# Patient Record
Sex: Female | Born: 1985 | Race: Black or African American | Hispanic: No | Marital: Single | Smoking: Never smoker
Health system: Southern US, Community
[De-identification: ages and names within clinical notes are randomized; demographics above are authoritative.]

## PROBLEM LIST (undated history)

## (undated) DIAGNOSIS — G43909 Migraine, unspecified, not intractable, without status migrainosus: Secondary | ICD-10-CM

---
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
## 2004-12-26 ENCOUNTER — Emergency Department (HOSPITAL_COMMUNITY): Admission: EM | Admit: 2004-12-26 | Discharge: 2004-12-26 | Payer: Self-pay | Admitting: *Deleted

## 2007-09-11 ENCOUNTER — Other Ambulatory Visit: Admission: RE | Admit: 2007-09-11 | Discharge: 2007-09-11 | Payer: Self-pay | Admitting: Family Medicine

## 2008-12-06 ENCOUNTER — Emergency Department (HOSPITAL_COMMUNITY): Admission: EM | Admit: 2008-12-06 | Discharge: 2008-12-07 | Payer: Self-pay | Admitting: Emergency Medicine

## 2009-02-19 ENCOUNTER — Other Ambulatory Visit: Admission: RE | Admit: 2009-02-19 | Discharge: 2009-02-19 | Payer: Self-pay | Admitting: Family Medicine

## 2009-09-06 ENCOUNTER — Emergency Department (HOSPITAL_COMMUNITY): Admission: EM | Admit: 2009-09-06 | Discharge: 2009-09-06 | Payer: Self-pay | Admitting: Emergency Medicine

## 2010-04-08 LAB — POCT CARDIAC MARKERS: Myoglobin, poc: 32 ng/mL (ref 12–200)

## 2011-05-31 ENCOUNTER — Inpatient Hospital Stay (HOSPITAL_COMMUNITY): Admission: RE | Admit: 2011-05-31 | Payer: Self-pay | Source: Ambulatory Visit

## 2011-05-31 ENCOUNTER — Other Ambulatory Visit (HOSPITAL_COMMUNITY)
Admission: RE | Admit: 2011-05-31 | Discharge: 2011-05-31 | Disposition: A | Discharge status: Home / Self Care | Payer: 59 | Source: Ambulatory Visit | Attending: Family Medicine | Admitting: Family Medicine

## 2011-05-31 ENCOUNTER — Other Ambulatory Visit: Payer: Self-pay | Admitting: Family Medicine

## 2011-05-31 DIAGNOSIS — Z124 Encounter for screening for malignant neoplasm of cervix: Secondary | ICD-10-CM | POA: Insufficient documentation

## 2011-05-31 DIAGNOSIS — R87810 Cervical high risk human papillomavirus (HPV) DNA test positive: Secondary | ICD-10-CM | POA: Insufficient documentation

## 2011-05-31 DIAGNOSIS — Z113 Encounter for screening for infections with a predominantly sexual mode of transmission: Secondary | ICD-10-CM | POA: Insufficient documentation

## 2013-09-18 ENCOUNTER — Other Ambulatory Visit (HOSPITAL_COMMUNITY)
Admission: RE | Admit: 2013-09-18 | Discharge: 2013-09-18 | Disposition: A | Discharge status: Home / Self Care | Payer: 59 | Source: Ambulatory Visit | Attending: Family Medicine | Admitting: Family Medicine

## 2013-09-18 ENCOUNTER — Other Ambulatory Visit: Payer: Self-pay | Admitting: Family Medicine

## 2013-09-18 DIAGNOSIS — Z1151 Encounter for screening for human papillomavirus (HPV): Secondary | ICD-10-CM | POA: Diagnosis present

## 2013-09-18 DIAGNOSIS — Z124 Encounter for screening for malignant neoplasm of cervix: Secondary | ICD-10-CM | POA: Diagnosis not present

## 2013-09-18 DIAGNOSIS — N760 Acute vaginitis: Secondary | ICD-10-CM | POA: Diagnosis not present

## 2013-09-18 DIAGNOSIS — Z113 Encounter for screening for infections with a predominantly sexual mode of transmission: Secondary | ICD-10-CM | POA: Insufficient documentation

## 2013-09-22 LAB — CYTOLOGY - PAP

## 2014-01-23 HISTORY — PX: FRACTURE SURGERY: SHX138

## 2014-07-29 ENCOUNTER — Emergency Department (HOSPITAL_BASED_OUTPATIENT_CLINIC_OR_DEPARTMENT_OTHER): Payer: 59

## 2014-07-29 ENCOUNTER — Encounter (HOSPITAL_BASED_OUTPATIENT_CLINIC_OR_DEPARTMENT_OTHER): Payer: Self-pay | Admitting: *Deleted

## 2014-07-29 ENCOUNTER — Emergency Department (HOSPITAL_BASED_OUTPATIENT_CLINIC_OR_DEPARTMENT_OTHER)
Admission: EM | Admit: 2014-07-29 | Discharge: 2014-07-29 | Disposition: A | Discharge status: Home / Self Care | Payer: 59 | Attending: Emergency Medicine | Admitting: Emergency Medicine

## 2014-07-29 DIAGNOSIS — G43909 Migraine, unspecified, not intractable, without status migrainosus: Secondary | ICD-10-CM | POA: Diagnosis not present

## 2014-07-29 DIAGNOSIS — Y998 Other external cause status: Secondary | ICD-10-CM | POA: Diagnosis not present

## 2014-07-29 DIAGNOSIS — Y92410 Unspecified street and highway as the place of occurrence of the external cause: Secondary | ICD-10-CM | POA: Insufficient documentation

## 2014-07-29 DIAGNOSIS — Z79899 Other long term (current) drug therapy: Secondary | ICD-10-CM | POA: Insufficient documentation

## 2014-07-29 DIAGNOSIS — Y9389 Activity, other specified: Secondary | ICD-10-CM | POA: Diagnosis not present

## 2014-07-29 DIAGNOSIS — S6991XA Unspecified injury of right wrist, hand and finger(s), initial encounter: Secondary | ICD-10-CM | POA: Diagnosis present

## 2014-07-29 DIAGNOSIS — S52501A Unspecified fracture of the lower end of right radius, initial encounter for closed fracture: Secondary | ICD-10-CM

## 2014-07-29 HISTORY — DX: Migraine, unspecified, not intractable, without status migrainosus: G43.909

## 2014-07-29 MED ORDER — HYDROCODONE-ACETAMINOPHEN 5-325 MG PO TABS
2.0000 | ORAL_TABLET | Freq: Once | ORAL | Status: AC
  Administered 2014-07-29: 2 via ORAL
  Filled 2014-07-29: qty 2

## 2014-07-29 MED ORDER — OXYCODONE-ACETAMINOPHEN 5-325 MG PO TABS: 1.0000 | ORAL_TABLET | Freq: Four times a day (QID) | ORAL | Status: AC | PRN

## 2014-07-29 NOTE — ED Notes (Signed)
D/c home with ride- directed to pharmacy to pick up meds 

## 2014-07-29 NOTE — ED Notes (Signed)
Patient assisted with getting dressed and preparing for discharge.
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
## 2014-07-29 NOTE — ED Notes (Signed)
Patient transported to X-ray 
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
## 2014-07-29 NOTE — ED Notes (Addendum)
Ice pack given. [employer] officer at bedside

## 2014-07-29 NOTE — ED Notes (Signed)
MD at bedside. 

## 2015-09-22 ENCOUNTER — Other Ambulatory Visit (HOSPITAL_COMMUNITY)
Admission: RE | Admit: 2015-09-22 | Discharge: 2015-09-22 | Disposition: A | Discharge status: Home / Self Care | Payer: 59 | Source: Ambulatory Visit | Attending: Obstetrics and Gynecology | Admitting: Obstetrics and Gynecology

## 2015-09-22 ENCOUNTER — Other Ambulatory Visit: Payer: Self-pay | Admitting: Obstetrics and Gynecology

## 2015-09-22 DIAGNOSIS — Z1151 Encounter for screening for human papillomavirus (HPV): Secondary | ICD-10-CM | POA: Insufficient documentation

## 2015-09-22 DIAGNOSIS — Z01419 Encounter for gynecological examination (general) (routine) without abnormal findings: Secondary | ICD-10-CM | POA: Insufficient documentation

## 2015-09-23 LAB — CYTOLOGY - PAP

## 2017-09-07 ENCOUNTER — Ambulatory Visit
Admission: RE | Admit: 2017-09-07 | Discharge: 2017-09-07 | Disposition: A | Discharge status: Home / Self Care | Payer: BLUE CROSS/BLUE SHIELD | Source: Ambulatory Visit | Attending: Family Medicine | Admitting: Family Medicine

## 2017-09-07 ENCOUNTER — Other Ambulatory Visit: Payer: Self-pay | Admitting: Family Medicine

## 2017-09-07 DIAGNOSIS — J209 Acute bronchitis, unspecified: Secondary | ICD-10-CM

## 2019-11-15 ENCOUNTER — Encounter (HOSPITAL_COMMUNITY): Payer: Self-pay | Admitting: Emergency Medicine

## 2019-11-15 ENCOUNTER — Emergency Department (HOSPITAL_COMMUNITY)
Admission: EM | Admit: 2019-11-15 | Discharge: 2019-11-16 | Disposition: A | Discharge status: Home / Self Care | Payer: 59 | Source: Home / Self Care | Attending: Emergency Medicine | Admitting: Emergency Medicine

## 2019-11-15 ENCOUNTER — Other Ambulatory Visit: Payer: Self-pay

## 2019-11-15 DIAGNOSIS — R0602 Shortness of breath: Secondary | ICD-10-CM | POA: Diagnosis not present

## 2019-11-15 DIAGNOSIS — J22 Unspecified acute lower respiratory infection: Secondary | ICD-10-CM | POA: Insufficient documentation

## 2019-11-15 DIAGNOSIS — U071 COVID-19: Secondary | ICD-10-CM

## 2019-11-15 LAB — I-STAT BETA HCG BLOOD, ED (MC, WL, AP ONLY): I-stat hCG, quantitative: <5 m[IU]/mL (ref <5)

## 2019-11-15 LAB — BASIC METABOLIC PANEL
Anion gap: 11 (ref 5–15)
BUN: 6 mg/dL (ref 6–20)
CO2: 24 mmol/L (ref 22–32)
Calcium: 8.4 mg/dL — ABNORMAL LOW (ref 8.9–10.3)
Chloride: 97 mmol/L — ABNORMAL LOW (ref 98–111)
Creatinine, Ser: 1.06 mg/dL — ABNORMAL HIGH (ref 0.44–1.00)
GFR, Estimated: >60 mL/min (ref >60)
Glucose, Bld: 112 mg/dL — ABNORMAL HIGH (ref 70–99)
Potassium: 3.2 mmol/L — ABNORMAL LOW (ref 3.5–5.1)
Sodium: 132 mmol/L — ABNORMAL LOW (ref 135–145)

## 2019-11-15 LAB — CBC
HCT: 35.9 % — ABNORMAL LOW (ref 36.0–46.0)
Hemoglobin: 11.3 g/dL — ABNORMAL LOW (ref 12.0–15.0)
MCH: 25.6 pg — ABNORMAL LOW (ref 26.0–34.0)
MCHC: 31.5 g/dL (ref 30.0–36.0)
MCV: 81.4 fL (ref 80.0–100.0)
Platelets: 204 10*3/uL (ref 150–400)
RBC: 4.41 MIL/uL (ref 3.87–5.11)
RDW: 14.8 % (ref 11.5–15.5)
WBC: 3.9 10*3/uL — ABNORMAL LOW (ref 4.0–10.5)
nRBC: 0 % (ref 0.0–0.2)

## 2019-11-15 LAB — TROPONIN I (HIGH SENSITIVITY)
Troponin I (High Sensitivity): 11 ng/L (ref <18)
Troponin I (High Sensitivity): 11 ng/L (ref <18)

## 2019-11-15 MED ORDER — ALBUTEROL SULFATE HFA 108 (90 BASE) MCG/ACT IN AERS
4.0000 | INHALATION_SPRAY | Freq: Once | RESPIRATORY_TRACT | Status: AC
  Administered 2019-11-16: 4 via RESPIRATORY_TRACT
  Filled 2019-11-15: qty 6.7

## 2019-11-15 MED ORDER — AEROCHAMBER PLUS FLO-VU LARGE MISC
1.0000 | Freq: Once | Status: AC
  Administered 2019-11-16: 1

## 2019-11-15 MED ORDER — DEXAMETHASONE 4 MG PO TABS
10.0000 mg | ORAL_TABLET | Freq: Once | ORAL | Status: AC
  Administered 2019-11-16: 10 mg via ORAL
  Filled 2019-11-15: qty 3

## 2019-11-15 NOTE — ED Triage Notes (Signed)
Pt states she tested positive for Covid this past  Thursday having increase SOB and CP.

## 2019-11-15 NOTE — ED Provider Notes (Signed)
[facility] EMERGENCY DEPARTMENT Provider Note  CSN: [id number] Arrival date & time: 11/15/19 2006  Chief Complaint(s) Shortness of Breath  HPI Elizabeth Christian is a 34 y.o. female who was diagnosed with COVID-19 earlier this week presents with shortness of breath and chest discomfort.  She reports that her symptoms began Tuesday, 6 days ago.  Symptoms have been worsening since onset with increased coughing and shortness of breath.  She denies any nausea or vomiting.  Still able to tolerate oral intake.  She does report loss of smell and taste.  No abdominal pain.  No other physical complaints.  HPI  Past Medical History Past Medical History:  Diagnosis Date  . Migraine    There are no problems to display for this patient.  Home Medication(s) Prior to Admission medications   Medication Sig Start Date End Date Taking? Authorizing Provider  multivitamin prental (TRINATAL) 60-1 MG TABS tablet Take 1 tablet by mouth daily.    [provider]  oxyCODONE-acetaminophen (PERCOCET/ROXICET) 5-325 MG per tablet Take 1-2 tablets by mouth every 6 (six) hours as needed for severe pain. 07/29/14   Blake Divine, MD  topiramate (TOPAMAX) 50 MG tablet Take 50 mg by mouth 2 (two) times daily.    [provider]                                                                                                                                    Past Surgical History History reviewed. No pertinent surgical history. Family History No family history on file.  Social History Social History   Tobacco Use  . Smoking status: Never Smoker  Substance Use Topics  . Alcohol use: Yes    Comment: occassional  . Drug use: No   Allergies Patient has no known allergies.  Review of Systems Review of Systems All other systems are reviewed and are negative for acute change except as noted in the HPI  Physical Exam Vital Signs  I have reviewed the triage vital signs BP  130/86 (BP Location: Right Arm)   Pulse (!) 109   Temp 99.9 F (37.7 C) (Oral)   Resp (!) 28   Ht 5\' 9"  (1.753 m)   Wt 123.4 kg   SpO2 92%   BMI 40.17 kg/m   Physical Exam Vitals reviewed.  Constitutional:      General: She is not in acute distress.    Appearance: She is well-developed. She is not diaphoretic.  HENT:     Head: Normocephalic and atraumatic.     Nose: Nose normal.  Eyes:     General: No scleral icterus.       Right eye: No discharge.        Left eye: No discharge.     Conjunctiva/sclera: Conjunctivae normal.     Pupils: Pupils are equal, round, and reactive to light.  Cardiovascular:     Rate and Rhythm: Normal  rate and regular rhythm.     Heart sounds: No murmur heard.  No friction rub. No gallop.   Pulmonary:     Effort: Pulmonary effort is normal. Tachypnea present. No respiratory distress.     Breath sounds: No stridor. Examination of the right-middle field reveals rales. Examination of the left-middle field reveals rales. Examination of the right-lower field reveals rales. Examination of the left-lower field reveals rales. Rales (faint ) present.     Comments: Speaking in full sentences.  Abdominal:     General: There is no distension.     Palpations: Abdomen is soft.     Tenderness: There is no abdominal tenderness.  Musculoskeletal:        General: No tenderness.     Cervical back: Normal range of motion and neck supple.  Skin:    General: Skin is warm and dry.     Findings: No erythema or rash.  Neurological:     Mental Status: She is alert and oriented to person, place, and time.     ED Results and Treatments Labs (all labs ordered are listed, but only abnormal results are displayed) Labs Reviewed  BASIC METABOLIC PANEL - Abnormal; Notable for the following components:      Result Value   Sodium 132 (*)    Potassium 3.2 (*)    Chloride 97 (*)    Glucose, Bld 112 (*)    Creatinine, Ser 1.06 (*)    Calcium 8.4 (*)    All other  components within normal limits  CBC - Abnormal; Notable for the following components:   WBC 3.9 (*)    Hemoglobin 11.3 (*)    HCT 35.9 (*)    MCH 25.6 (*)    All other components within normal limits  I-STAT BETA HCG BLOOD, ED ([facility], [facility], [facility] ONLY)  TROPONIN I (HIGH SENSITIVITY)  TROPONIN I (HIGH SENSITIVITY)                                                                                                                         EKG  EKG Interpretation  Date/Time:  Saturday November 15 2019 20:16:43 EDT Ventricular Rate:  119 PR Interval:  146 QRS Duration: 74 QT Interval:  306 QTC Calculation: 430 R Axis:   -3 Text Interpretation: Sinus tachycardia Otherwise normal ECG Since last tracing rate faster Otherwise no significant change Confirmed by Drema Pry [phone]) on 11/15/2019 11:46:51 PM      Radiology No results found.  Pertinent labs & imaging results that were available during my care of the patient were reviewed by me and considered in my medical decision making (see chart for details).  Medications Ordered in ED Medications  AeroChamber Plus Flo-Vu Large MISC 1 each (has no administration in time range)  albuterol (VENTOLIN HFA) 108 (90 Base) MCG/ACT inhaler 4 puff (4 puffs Inhalation Given 11/16/19 0015)  dexamethasone (DECADRON) tablet 10 mg (10 mg Oral Given 11/16/19 0014)  Procedures Procedures  (including critical care time)  Medical Decision Making / ED Course I have reviewed the nursing notes for this encounter and the patient's prior records (if available in EHR or on provided paperwork).   Elizabeth Christian was evaluated in Emergency Department on 11/16/2019 for the symptoms described in the history of present illness. She was evaluated in the context of the global COVID-19 pandemic, which necessitated consideration that the  patient might be at risk for infection with the SARS-CoV-2 virus that causes COVID-19. Institutional protocols and algorithms that pertain to the evaluation of patients at risk for COVID-19 are in a state of rapid change based on information released by regulatory bodies including the CDC and federal and state organizations. These policies and algorithms were followed during the patient's care in the ED.  Patient presents with worsening symptoms related to COVID-19.  Patient is tachypneic but satting well on room air with sats of 94% and greater.  Patient has mild tachycardia expected in her situation.  Patient was provided with albuterol puffs and Decadron.  On reassessment, I feel she is stable for continued outpatient management.  Given the patient's BMI, she will qualify for MAB.  I contacted the infusion center who will assist in scheduling her visit.       Final Clinical Impression(s) / ED Diagnoses Final diagnoses:  Lower respiratory tract infection due to COVID-19 virus    The patient appears reasonably screened and/or stabilized for discharge and I doubt any other medical condition or other [facility] requiring further screening, evaluation, or treatment in the ED at this time prior to discharge. Safe for discharge with strict return precautions.  Disposition: Discharge  Condition: Good  I have discussed the results, Dx and Tx plan with the patient/family who expressed understanding and agree(s) with the plan. Discharge instructions discussed at length. The patient/family was given strict return precautions who verbalized understanding of the instructions. No further questions at time of discharge.    ED Discharge Orders    None        Follow Up: Clinic, [facility] Covid-19 Infusion Treatment [phone]     Mila Palmer, MD [address] [place] [address] [phone]  Schedule an appointment as soon as possible for a visit  As  needed     This chart was dictated using voice recognition software.  Despite best efforts to proofread,  errors can occur which can change the documentation meaning.   Nira Conn, MD 11/16/19 [phone]

## 2019-11-16 ENCOUNTER — Telehealth: Payer: Self-pay | Admitting: Nurse Practitioner

## 2019-11-16 ENCOUNTER — Other Ambulatory Visit: Payer: Self-pay | Admitting: Nurse Practitioner

## 2019-11-16 DIAGNOSIS — Z6841 Body Mass Index (BMI) 40.0 and over, adult: Secondary | ICD-10-CM

## 2019-11-16 DIAGNOSIS — U071 COVID-19: Secondary | ICD-10-CM

## 2019-11-16 MED ORDER — PREDNISONE 50 MG PO TABS: 50.0000 mg | ORAL_TABLET | Freq: Every day | ORAL | 0 refills | Status: DC

## 2019-11-16 NOTE — Telephone Encounter (Signed)
COVID [facility] Contact Note   Qualifiers: BMI 40, Not vaccinated,  Symptoms: ShOB, Chest discomfort, loss of taste, loss smell Symptom Onset: 10/19  I connected by phone with Elizabeth Christian on 11/16/2019 at 2:48 PM to discuss the potential use of a new treatment for mild to moderate COVID-19 viral infection in non-hospitalized patients.  This patient is a 34 y.o. female that meets the FDA criteria for Emergency Use Authorization of COVID monoclonal antibody casirivimab/imdevimab or bamlanivimab/eteseviamb.  Has a (+) direct SARS-CoV-2 viral test result  Has mild or moderate COVID-19   Is NOT hospitalized due to COVID-19  Is within 10 days of symptom onset  Has at least one of the high risk factor(s) for progression to severe COVID-19 and/or hospitalization as defined in EUA.  Specific high risk criteria : BMI > 25, non-vaccinated, SVI  Day 7-8 of symptoms   I have spoken and communicated the following to the patient or parent/caregiver regarding COVID monoclonal antibody treatment:  1. FDA has authorized the emergency use for the treatment of mild to moderate COVID-19 in adults and pediatric patients with positive results of direct SARS-CoV-2 viral testing who are 35 years of age and older weighing at least 40 kg, and who are at high risk for progressing to severe COVID-19 and/or hospitalization.  2. The significant known and potential risks and benefits of COVID monoclonal antibody, and the extent to which such potential risks and benefits are unknown.  3. Information on available alternative treatments and the risks and benefits of those alternatives, including clinical trials.  4. Patients treated with COVID monoclonal antibody should continue to self-isolate and use infection control measures (e.g., wear mask, isolate, social distance, avoid sharing personal items, clean and disinfect "high touch" surfaces, and frequent handwashing) according to CDC guidelines.   5. The  patient or parent/caregiver has the option to accept or refuse COVID monoclonal antibody treatment.  After reviewing this information with the patient, the patient has agreed to receive one of the available covid 19 monoclonal antibodies and will be provided an appropriate fact sheet prior to infusion. Tollie Eth, NP 11/16/2019 2:48 PM

## 2019-11-16 NOTE — ED Notes (Signed)
Pt d/c by MD, Pt is provided w/ d/c instructions and follow up care. Pt is out of the ED ambulatory by self

## 2019-11-16 NOTE — ED Notes (Signed)
Provided a spacer for pt to take home

## 2019-11-17 ENCOUNTER — Emergency Department (HOSPITAL_COMMUNITY): Payer: 59

## 2019-11-17 ENCOUNTER — Other Ambulatory Visit: Payer: Self-pay

## 2019-11-17 ENCOUNTER — Inpatient Hospital Stay (HOSPITAL_COMMUNITY)
Admission: EM | Admit: 2019-11-17 | Discharge: 2019-11-21 | DRG: 177 | Disposition: A | Discharge status: Home / Self Care | Payer: 59 | Attending: Internal Medicine | Admitting: Internal Medicine

## 2019-11-17 ENCOUNTER — Encounter (HOSPITAL_COMMUNITY): Payer: Self-pay | Admitting: Radiology

## 2019-11-17 DIAGNOSIS — Z7952 Long term (current) use of systemic steroids: Secondary | ICD-10-CM | POA: Diagnosis not present

## 2019-11-17 DIAGNOSIS — U071 COVID-19: Secondary | ICD-10-CM | POA: Diagnosis present

## 2019-11-17 DIAGNOSIS — E871 Hypo-osmolality and hyponatremia: Secondary | ICD-10-CM | POA: Diagnosis present

## 2019-11-17 DIAGNOSIS — E872 Acidosis: Secondary | ICD-10-CM | POA: Diagnosis present

## 2019-11-17 DIAGNOSIS — Z713 Dietary counseling and surveillance: Secondary | ICD-10-CM | POA: Diagnosis not present

## 2019-11-17 DIAGNOSIS — N179 Acute kidney failure, unspecified: Secondary | ICD-10-CM | POA: Diagnosis present

## 2019-11-17 DIAGNOSIS — R0602 Shortness of breath: Secondary | ICD-10-CM | POA: Diagnosis present

## 2019-11-17 DIAGNOSIS — R739 Hyperglycemia, unspecified: Secondary | ICD-10-CM | POA: Diagnosis present

## 2019-11-17 DIAGNOSIS — R000 Tachycardia, unspecified: Secondary | ICD-10-CM

## 2019-11-17 DIAGNOSIS — Z6841 Body Mass Index (BMI) 40.0 and over, adult: Secondary | ICD-10-CM

## 2019-11-17 DIAGNOSIS — J9601 Acute respiratory failure with hypoxia: Secondary | ICD-10-CM | POA: Diagnosis present

## 2019-11-17 DIAGNOSIS — R7401 Elevation of levels of liver transaminase levels: Secondary | ICD-10-CM | POA: Diagnosis present

## 2019-11-17 DIAGNOSIS — J1282 Pneumonia due to coronavirus disease 2019: Secondary | ICD-10-CM | POA: Diagnosis present

## 2019-11-17 DIAGNOSIS — Z79899 Other long term (current) drug therapy: Secondary | ICD-10-CM | POA: Diagnosis not present

## 2019-11-17 DIAGNOSIS — E876 Hypokalemia: Secondary | ICD-10-CM | POA: Diagnosis present

## 2019-11-17 LAB — COMPREHENSIVE METABOLIC PANEL
ALT: 87 U/L — ABNORMAL HIGH (ref 0–44)
AST: 117 U/L — ABNORMAL HIGH (ref 15–41)
Albumin: 2.8 g/dL — ABNORMAL LOW (ref 3.5–5.0)
Alkaline Phosphatase: 81 U/L (ref 38–126)
Anion gap: 14 (ref 5–15)
BUN: 8 mg/dL (ref 6–20)
CO2: 24 mmol/L (ref 22–32)
Calcium: 8.5 mg/dL — ABNORMAL LOW (ref 8.9–10.3)
Chloride: 96 mmol/L — ABNORMAL LOW (ref 98–111)
Creatinine, Ser: 1.16 mg/dL — ABNORMAL HIGH (ref 0.44–1.00)
GFR, Estimated: >60 mL/min (ref >60)
Glucose, Bld: 128 mg/dL — ABNORMAL HIGH (ref 70–99)
Potassium: 3.4 mmol/L — ABNORMAL LOW (ref 3.5–5.1)
Sodium: 134 mmol/L — ABNORMAL LOW (ref 135–145)
Total Bilirubin: 0.9 mg/dL (ref 0.3–1.2)
Total Protein: 7.1 g/dL (ref 6.5–8.1)

## 2019-11-17 LAB — PROCALCITONIN: Procalcitonin: <0.1 ng/mL

## 2019-11-17 LAB — GLUCOSE, CAPILLARY
Glucose-Capillary: 135 mg/dL — ABNORMAL HIGH (ref 70–99)
Glucose-Capillary: 137 mg/dL — ABNORMAL HIGH (ref 70–99)
Glucose-Capillary: 149 mg/dL — ABNORMAL HIGH (ref 70–99)

## 2019-11-17 LAB — CBC WITH DIFFERENTIAL/PLATELET
Abs Immature Granulocytes: 0.05 10*3/uL (ref 0.00–0.07)
Basophils Absolute: 0 10*3/uL (ref 0.0–0.1)
Basophils Relative: 0 %
Eosinophils Absolute: 0 10*3/uL (ref 0.0–0.5)
Eosinophils Relative: 0 %
HCT: 35.5 % — ABNORMAL LOW (ref 36.0–46.0)
Hemoglobin: 11.3 g/dL — ABNORMAL LOW (ref 12.0–15.0)
Immature Granulocytes: 1 %
Lymphocytes Relative: 7 %
Lymphs Abs: 0.6 10*3/uL — ABNORMAL LOW (ref 0.7–4.0)
MCH: 26.1 pg (ref 26.0–34.0)
MCHC: 31.8 g/dL (ref 30.0–36.0)
MCV: 82 fL (ref 80.0–100.0)
Monocytes Absolute: 0.2 10*3/uL (ref 0.1–1.0)
Monocytes Relative: 2 %
Neutro Abs: 7.9 10*3/uL — ABNORMAL HIGH (ref 1.7–7.7)
Neutrophils Relative %: 90 %
Platelets: 200 10*3/uL (ref 150–400)
RBC: 4.33 MIL/uL (ref 3.87–5.11)
RDW: 15.1 % (ref 11.5–15.5)
WBC: 8.8 10*3/uL (ref 4.0–10.5)
nRBC: 0 % (ref 0.0–0.2)

## 2019-11-17 LAB — C-REACTIVE PROTEIN: CRP: 8.3 mg/dL — ABNORMAL HIGH (ref <1.0)

## 2019-11-17 LAB — I-STAT BETA HCG BLOOD, ED (MC, WL, AP ONLY): I-stat hCG, quantitative: <5 m[IU]/mL (ref <5)

## 2019-11-17 LAB — FIBRINOGEN: Fibrinogen: 628 mg/dL — ABNORMAL HIGH (ref 210–475)

## 2019-11-17 LAB — FERRITIN: Ferritin: 105 ng/mL (ref 11–307)

## 2019-11-17 LAB — TROPONIN I (HIGH SENSITIVITY)
Troponin I (High Sensitivity): 20 ng/L — ABNORMAL HIGH (ref <18)
Troponin I (High Sensitivity): 16 ng/L (ref <18)

## 2019-11-17 LAB — D-DIMER, QUANTITATIVE: D-Dimer, Quant: 0.61 ug/mL-FEU — ABNORMAL HIGH (ref 0.00–0.50)

## 2019-11-17 LAB — LACTATE DEHYDROGENASE: LDH: 375 U/L — ABNORMAL HIGH (ref 98–192)

## 2019-11-17 LAB — TRIGLYCERIDES: Triglycerides: 107 mg/dL (ref <150)

## 2019-11-17 LAB — LACTIC ACID, PLASMA
Lactic Acid, Venous: 2.9 mmol/L (ref 0.5–1.9)
Lactic Acid, Venous: 1.6 mmol/L (ref 0.5–1.9)

## 2019-11-17 LAB — MRSA PCR SCREENING: MRSA by PCR: NEGATIVE

## 2019-11-17 MED ORDER — SODIUM CHLORIDE 0.9 % IV SOLN
100.0000 mg | INTRAVENOUS | Status: AC
  Administered 2019-11-17 (×2): 100 mg via INTRAVENOUS
  Filled 2019-11-17 (×2): qty 20

## 2019-11-17 MED ORDER — PREDNISONE 20 MG PO TABS: 50.0000 mg | ORAL_TABLET | Freq: Every day | ORAL | Status: DC

## 2019-11-17 MED ORDER — SODIUM CHLORIDE 0.9 % IV SOLN
100.0000 mg | Freq: Every day | INTRAVENOUS | Status: AC
  Administered 2019-11-18 – 2019-11-21 (×4): 100 mg via INTRAVENOUS
  Filled 2019-11-17 (×4): qty 20

## 2019-11-17 MED ORDER — ENOXAPARIN SODIUM 80 MG/0.8ML ~~LOC~~ SOLN
65.0000 mg | SUBCUTANEOUS | Status: DC
  Administered 2019-11-17 – 2019-11-20 (×4): 65 mg via SUBCUTANEOUS
  Filled 2019-11-17 (×4): qty 0.8

## 2019-11-17 MED ORDER — SODIUM CHLORIDE 0.9% FLUSH: 3.0000 mL | INTRAVENOUS | Status: DC | PRN

## 2019-11-17 MED ORDER — METHYLPREDNISOLONE SODIUM SUCC 125 MG IJ SOLR
60.0000 mg | Freq: Two times a day (BID) | INTRAMUSCULAR | Status: DC
  Administered 2019-11-17 – 2019-11-18 (×2): 60 mg via INTRAVENOUS
  Filled 2019-11-17 (×2): qty 2

## 2019-11-17 MED ORDER — SODIUM CHLORIDE 0.9 % IV BOLUS
500.0000 mL | Freq: Once | INTRAVENOUS | Status: AC
  Administered 2019-11-17: 500 mL via INTRAVENOUS

## 2019-11-17 MED ORDER — IOHEXOL 350 MG/ML SOLN
75.0000 mL | Freq: Once | INTRAVENOUS | Status: AC | PRN
  Administered 2019-11-17: 75 mL via INTRAVENOUS

## 2019-11-17 MED ORDER — INSULIN ASPART 100 UNIT/ML ~~LOC~~ SOLN
0.0000 [IU] | Freq: Three times a day (TID) | SUBCUTANEOUS | Status: DC
  Administered 2019-11-17 (×2): 1 [IU] via SUBCUTANEOUS
  Administered 2019-11-18: 2 [IU] via SUBCUTANEOUS
  Administered 2019-11-18 – 2019-11-20 (×4): 1 [IU] via SUBCUTANEOUS

## 2019-11-17 MED ORDER — DEXAMETHASONE SODIUM PHOSPHATE 10 MG/ML IJ SOLN
10.0000 mg | Freq: Once | INTRAMUSCULAR | Status: AC
  Administered 2019-11-17: 10 mg via INTRAVENOUS
  Filled 2019-11-17: qty 1

## 2019-11-17 MED ORDER — SODIUM CHLORIDE 0.9 % IV SOLN: 100.0000 mg | Freq: Every day | INTRAVENOUS | Status: DC

## 2019-11-17 MED ORDER — TOCILIZUMAB 400 MG/20ML IV SOLN
800.0000 mg | Freq: Once | INTRAVENOUS | Status: AC
  Administered 2019-11-17: 800 mg via INTRAVENOUS
  Filled 2019-11-17: qty 40

## 2019-11-17 MED ORDER — SODIUM CHLORIDE 0.9 % IV SOLN: 200.0000 mg | Freq: Once | INTRAVENOUS | Status: DC

## 2019-11-17 MED ORDER — SODIUM CHLORIDE 0.9 % IV SOLN: 250.0000 mL | INTRAVENOUS | Status: DC | PRN

## 2019-11-17 MED ORDER — SODIUM CHLORIDE 0.9% FLUSH
3.0000 mL | Freq: Two times a day (BID) | INTRAVENOUS | Status: DC
  Administered 2019-11-17 – 2019-11-21 (×9): 3 mL via INTRAVENOUS

## 2019-11-17 NOTE — ED Provider Notes (Signed)
[facility] EMERGENCY DEPARTMENT Provider Note   CSN: [id number] Arrival date & time: 11/17/19  0454     History Chief Complaint  Patient presents with  . Covid Positive  . Shortness of Breath    Elizabeth Christian is a 34 y.o. female with a hx of migraine presents to the Emergency Department complaining of gradual, persistent, progressively worsening shortness of breath and chest discomfort onset Tuesday, 6 days ago.  Patient recently tested positive for COVID-19.  Her sister is sick with the same.  Yesterday she was evaluated in the emergency department for similar symptoms, was without hypoxia vomiting or hypotension.  She was able to tolerate p.o. I did not have abdominal pain.  She has had associated loss of taste and smell.  Patient is fully vaccinated for COVID-19.  Tonight, patient became acutely short of breath despite use of albuterol treatments.  Ambulation made her symptoms worse.  Upon EMS arrival patient found to have oxygen saturation at 40% and was tachycardic.  Placed on nonrebreather at 15 L with improvement.  Patient significantly tachypneic prehospital.  Level 5 caveat for acuity of condition.   The history is provided by the patient, medical records and the EMS personnel. No language interpreter was used.       Past Medical History:  Diagnosis Date  . Migraine     There are no problems to display for this patient.   No past surgical history on file.   OB History   No obstetric history on file.     No family history on file.  Social History   Tobacco Use  . Smoking status: Never Smoker  Substance Use Topics  . Alcohol use: Yes    Comment: occassional  . Drug use: No    Home Medications Prior to Admission medications   Medication Sig Start Date End Date Taking? Authorizing Provider  multivitamin prental (TRINATAL) 60-1 MG TABS tablet Take 1 tablet by mouth daily.    [provider]  oxyCODONE-acetaminophen  (PERCOCET/ROXICET) 5-325 MG per tablet Take 1-2 tablets by mouth every 6 (six) hours as needed for severe pain. 07/29/14   Blake Divine, MD  predniSONE (DELTASONE) 50 MG tablet Take 1 tablet (50 mg total) by mouth daily. 11/16/19   Tollie Eth, NP  topiramate (TOPAMAX) 50 MG tablet Take 50 mg by mouth 2 (two) times daily.    [provider]    Allergies    Patient has no known allergies.  Review of Systems   Review of Systems  Unable to perform ROS: Acuity of condition  Constitutional: Positive for chills.  Respiratory: Positive for cough, chest tightness and shortness of breath.     Physical Exam Updated Vital Signs BP (!) 142/92   Pulse (!) 133   Temp 97.8 F (36.6 C)   Resp (!) 52   Ht 5\' 9"  (1.753 m)   Wt 127 kg   SpO2 (!) 15%   BMI 41.35 kg/m   Physical Exam Vitals and nursing note reviewed.  Constitutional:      General: She is in acute distress.     Appearance: She is ill-appearing. She is not diaphoretic.  HENT:     Head: Normocephalic.  Eyes:     General: No scleral icterus.    Conjunctiva/sclera: Conjunctivae normal.  Cardiovascular:     Rate and Rhythm: Regular rhythm. Tachycardia present.     Pulses: Normal pulses.          Radial pulses  are 2+ on the right side and 2+ on the left side.       Dorsalis pedis pulses are 2+ on the right side and 2+ on the left side.  Pulmonary:     Effort: Tachypnea, accessory muscle usage, respiratory distress and retractions present. No prolonged expiration.     Breath sounds: No stridor. Rales ( Throughout) present.     Comments: Equal chest rise. Significant increased work of breathing. Abdominal:     General: There is no distension.     Palpations: Abdomen is soft.     Tenderness: There is no abdominal tenderness. There is no guarding or rebound.  Musculoskeletal:     Cervical back: Normal range of motion.     Comments: Moves all extremities equally and without difficulty. No calf tenderness or peripheral  edema.  Skin:    General: Skin is warm and dry.     Capillary Refill: Capillary refill takes less than 2 seconds.  Neurological:     Mental Status: She is alert.     GCS: GCS eye subscore is 4. GCS verbal subscore is 5. GCS motor subscore is 6.     Comments: Speech is clear and goal oriented.  Psychiatric:        Mood and Affect: Mood is anxious.     ED Results / Procedures / Treatments   Labs (all labs ordered are listed, but only abnormal results are displayed) Labs Reviewed  CBC WITH DIFFERENTIAL/PLATELET - Abnormal; Notable for the following components:      Result Value   Hemoglobin 11.3 (*)    HCT 35.5 (*)    Neutro Abs 7.9 (*)    Lymphs Abs 0.6 (*)    All other components within normal limits  CULTURE, BLOOD (ROUTINE X 2)  CULTURE, BLOOD (ROUTINE X 2)  LACTIC ACID, PLASMA  LACTIC ACID, PLASMA  COMPREHENSIVE METABOLIC PANEL  D-DIMER, QUANTITATIVE (NOT AT [facility])  PROCALCITONIN  LACTATE DEHYDROGENASE  TRIGLYCERIDES  FIBRINOGEN  C-REACTIVE PROTEIN  FERRITIN  I-STAT BETA HCG BLOOD, ED ([facility], [facility], [facility] ONLY)    EKG EKG Interpretation  Date/Time:  Monday November 17 2019 04:59:27 EDT Ventricular Rate:  129 PR Interval:    QRS Duration: 90 QT Interval:  302 QTC Calculation: 443 R Axis:   42 Text Interpretation: Sinus tachycardia Borderline T wave abnormalities When compared with ECG of 11/15/2019, No significant change was found Confirmed by Dione Booze ([id number]) on 11/17/2019 5:15:04 AM   Radiology DG Chest Port 1 View  Result Date: 11/17/2019 CLINICAL DATA:  Shortness of breath and COVID EXAM: PORTABLE CHEST 1 VIEW COMPARISON:  Nine days ago FINDINGS: Lower lung volumes with new bilateral hazy infiltrates. No effusion or pneumothorax. Normal heart size. IMPRESSION: Low volume chest with multifocal infiltrate. Electronically Signed   By: Marnee Spring M.D.   On: 11/17/2019 05:21    Procedures .Critical Care Performed by: Dierdre Forth, PA-C Authorized  by: Dierdre Forth, PA-C   Critical care provider statement:    Critical care time (minutes):  45   Critical care time was exclusive of:  Separately billable procedures and treating other patients and teaching time   Critical care was necessary to treat or prevent imminent or life-threatening deterioration of the following conditions:  Respiratory failure   Critical care was time spent personally by me on the following activities:  Discussions with consultants, evaluation of patient's response to treatment, examination of patient, ordering and performing treatments and interventions, ordering and review of laboratory studies,  ordering and review of radiographic studies, pulse oximetry, re-evaluation of patient's condition, obtaining history from patient or surrogate and review of old charts   I assumed direction of critical care for this patient from another provider in my specialty: no     (including critical care time)  Medications Ordered in ED Medications  remdesivir 100 mg in sodium chloride 0.9 % 100 mL IVPB (has no administration in time range)  remdesivir 100 mg in sodium chloride 0.9 % 100 mL IVPB (has no administration in time range)  dexamethasone (DECADRON) injection 10 mg (10 mg Intravenous Given 11/17/19 0515)    ED Course  I have reviewed the triage vital signs and the nursing notes.  Pertinent labs & imaging results that were available during my care of the patient were reviewed by me and considered in my medical decision making (see chart for details).  Clinical Course as of Nov 17 631  [facility] Nov 17, 2019  0550 Baseline  Hemoglobin(!): 11.3 [HM]  0613 Worsening AKI  Creatinine(!): 1.16 [HM]  0613 Elevated. Expected in the setting of COVID-19 however given severe tachycardia will obtain CTA.  D-Dimer, Quant(!): 0.61 [HM]  [id number] Elevated  LDH(!): 375 [HM]  0614 Fibrinogen(!): 628 [HM]    Clinical Course User Index [HM] Ranetta Armacost, Boyd Kerbs   MDM  Rules/Calculators/A&P                           Patient presents with severe hypoxemia in the setting of COVID-19.  Evaluated yesterday however has become significantly worse.  Patient was scheduled for monoclonal antibodies later today but did not make that appointment.  Patient with significant tachycardia here in the emergency department and profound hypoxia.  Remains on nonrebreather at 15 L.  Labs drawn.  Patient given Decadron and remdesivir.  She will need admission.  This time patient is a full code.  5:51 AM Chest x-ray with bilateral groundglass infiltrates consistent with COVID-19.  Personally evaluated these images.    Given profound tachycardia in the setting of COVID-19 concern for possible pulmonary embolism or myocarditis.  Troponin and CT angiogram pending.    6:34 AM At shift change care was transferred to [facility] [place] who will follow pending studies, re-evaulate and determine disposition.     Final Clinical Impression(s) / ED Diagnoses Final diagnoses:  Acute hypoxemic respiratory failure due to COVID-19 [facility])  Tachycardia  Shortness of breath    Rx / DC Orders ED Discharge Orders    None       Nabiha Planck, Boyd Kerbs 11/17/19 4270    Nira Conn, MD 11/17/19 [phone]

## 2019-11-17 NOTE — ED Provider Notes (Signed)
Patient was received at handoff from [facility] she provided HPI, current work-up, likely disposition please see her note for full detail.  In short patient arrives via EMS due to shortness of breath placed on a nonrebreather.  Patient was seen in the emergency department on 10/23 for shortness of breath after being diagnosed with Covid on 10/21, she does endorse that she is Covid vaccinated with the [place] vaccine.  She was provided with albuterol and Decadron and was stable for discharge.  Unfortunately, her shortness of breath worsened despite the albuterol and Decadron she received a few days ago.  Patient endorses worsening shortness of breath on exertion and has occasional left-sided chest tightness due to her increased breathing.  She denies any cardiac history.  She denies fevers chills, nasogastric cough congestion, abdominal pain nausea, vomiting, diarrhea, pedal edema.  She states she has no history of leg swelling or leg pain, no DVT or PE history, is not on hormone therapy, no recent surgeries, no long immobilization.    Previous provider ordered lab work showing CBC no leukocytosis, normocytic anemia appears to be a baseline for patient.  CMP showing hyponatremia, hypokalemia, hyperglycemia of 128, increased creatinine of 1.16, albumin 2.4, elevated AST and ALT, no anion gap noted.  Patient has elevated lactic 2.9, D-dimer elevated at 0.61, troponin of 20, hCG less than 5.  Chest x-ray shows low lung volume with multiple infiltrates.  EKG shows sinus tach without signs of ischemia no ST elevation depression noted.  CT angio chest ordered to rule out possible PE.  Patient remains on 15 L nonrebreather, provided Decadron and remdesivir.  Physical Exam  BP 111/65   Pulse (!) 117   Temp 97.8 F (36.6 C)   Resp (!) 52   Ht 5\' 9"  (1.753 m)   Wt 127 kg   SpO2 99%   BMI 41.35 kg/m   Physical Exam Vitals and nursing note reviewed.  Constitutional:      General: She is in acute  distress.     Appearance: She is ill-appearing.     Comments: Patient on 15 L nonrebreather.  Vital signs significant for tachycardia and tachypnea.  HENT:     Head: Normocephalic and atraumatic.     Nose: No congestion.     Mouth/Throat:     Mouth: Mucous membranes are moist.     Pharynx: Oropharynx is clear.  Eyes:     General: No scleral icterus. Cardiovascular:     Rate and Rhythm: Normal rate and regular rhythm.     Pulses: Normal pulses.     Heart sounds: No murmur heard.  No friction rub. No gallop.   Pulmonary:     Effort: No respiratory distress.     Breath sounds: No wheezing, rhonchi or rales.     Comments: On exam patient remains on 15 L nonrebreather, lung sounds had bilateral rales heard in the lower lobes with tight sounding chest.  No wheezing, rhonchi, letter noted. Abdominal:     General: There is no distension.     Palpations: Abdomen is soft.     Tenderness: There is no abdominal tenderness. There is no right CVA tenderness, left CVA tenderness or guarding.  Musculoskeletal:        General: No swelling.     Right lower leg: No edema.     Left lower leg: No edema.  Skin:    General: Skin is warm and dry.     Capillary Refill: Capillary refill takes less  than 2 seconds.     Findings: No rash.  Neurological:     Mental Status: She is alert.  Psychiatric:        Mood and Affect: Mood normal.     ED Course/Procedures   Clinical Course as of Nov 17 642  [facility] Nov 17, 2019  0550 Baseline  Hemoglobin(!): 11.3 [HM]  0613 Worsening AKI  Creatinine(!): 1.16 [HM]  0613 Elevated. Expected in the setting of COVID-19 however given severe tachycardia will obtain CTA.  D-Dimer, Quant(!): 0.61 [HM]  [id number] Elevated  LDH(!): 375 [HM]  0614 Fibrinogen(!): 628 [HM]    Clinical Course User Index [HM] Muthersbaugh, Dahlia Client, PA-C    Procedures  MDM  I have personally reviewed all imaging, labs and have interpreted them.  CT angio chest is negative for PE but does  show moderate to severe multifocal pneumonia pattern.  Will provide patient with 500 cc of normal saline for increased creatinine and lactate of 2.9.  Due to increased respiratory requirements continued on 15 L nonrebreather will consult with clinical care for recommendation and evaluation.  Spoke with Raymon Mutton, NP of critical care who came and evaluate the patient.  She feels patient is stable to be admitted to the hospitalist.  They will be readily available for consult if needed.  Consulted with hospitalist.  Spoke with Dr.Gherghe who agrees patient needs to be admitted for further evaluation.  He will come and assess the patient.  I suspect patient's symptoms are secondary to COVID-19 patient will require further observation and management.   Patient appears to be stable, vital signs have remained stable.  Patient care will be transferred to hospitalist team for further evaluation management.            Carroll Sage, PA-C 11/17/19 1014    Nira Conn, MD 11/17/19 [phone]

## 2019-11-17 NOTE — Progress Notes (Signed)
   11/17/19 1130  Assess: MEWS Score  Temp 99.1 F (37.3 C)  BP 131/82  Pulse Rate (!) 107  ECG Heart Rate (!) 108  Resp (!) 25  Level of Consciousness Alert  SpO2 94 %  O2 Device HFNC  Patient Activity (if Appropriate) In bed  O2 Flow Rate (L/min) 15 L/min  Assess: MEWS Score  MEWS Temp 0  MEWS Systolic 0  MEWS Pulse 1  MEWS RR 1  MEWS LOC 0  MEWS Score 2  MEWS Score Color Yellow  Assess: if the MEWS score is Yellow or Red  Were vital signs taken at a resting state? Yes  Focused Assessment No change from prior assessment  Early Detection of Sepsis Score *See Row Information* Low  MEWS guidelines implemented *See Row Information* Yes  Treat  MEWS Interventions Administered scheduled meds/treatments;Escalated (See documentation below)  Pain Scale 0-10  Pain Score 0  Take Vital Signs  Increase Vital Sign Frequency  Yellow: Q 2hr X 2 then Q 4hr X 2, if remains yellow, continue Q 4hrs  Escalate  MEWS: Escalate Yellow: discuss with charge nurse/RN and consider discussing with provider and RRT  Notify: Charge Nurse/RN  Name of Charge Nurse/RN Notified Selena Batten  Date Charge Nurse/RN Notified 11/17/19  Time Charge Nurse/RN Notified 1202  Notify: Provider  Provider Name/Title Dr. Elvera Lennox  Date Provider Notified 11/17/19  Time Provider Notified 1205  Notification Type Page  Notification Reason Other (Comment) (Per protocol, MEWS score yellow)  Response No new orders  Date of Provider Response 11/17/19  Time of Provider Response 1210

## 2019-11-17 NOTE — Progress Notes (Signed)
Patient arrived to unit from ED. On 15L HFNC and sats are in the mid 90s. Patient is alert and oriented x4. ID band verified with patient. Belongings and cell phone at bedside. Patient is resting comfortably in bed, call bell and bedside table are within reach.

## 2019-11-17 NOTE — ED Notes (Addendum)
Patient returns d/t sob asnd difficulty breathing. covid pos Thursday. Patient was in the 40s spo2 on ra when fire department there. SPO2 decreases quickly. Patient rr in 74s and is co cough and sore throat. ronchi present bilat breath sounds. Patient maintaing o2 sat with 15L [place].

## 2019-11-17 NOTE — Consult Note (Signed)
NAME:  Elizabeth Christian, MRN:  [id number], DOB:  16-Feb-1985, LOS: 0 ADMISSION DATE:  11/17/2019, CONSULTATION DATE: 11/17/2019 REFERRING MD:  Dr. Donnald Garre, CHIEF COMPLAINT: Respiratory distress secondary to Covid  Brief History   34 year old female vaccinated against COVID-19 presents with worsening shortness of breath with known diagnosis of Covid.  PCCM consulted for questionable need of ICU admission given positive lactic acid, dyspnea, and high supplemental oxygen requirement.   History of present illness   Elizabeth Christian is a 34 year old female with no pertinent past medical history who presented to the emergency department with complaints of worsening shortness of breath.  Patient was diagnosed positive with COVID-19 at [facility] 10/21.  Patient reports since diagnosis shortness of breath has progressively worsened.  She presented to this facility's emergency department 10/23 with complaints of worsening shortness of breath and prescribed albuterol inhaler and Decadron and discharged home.  Patient reports albuterol treatments did not improve shortness of breath and home oxygen saturations dropped to 64% prompting readmission to the emergency department.  She also reports left sided substernal chest tightness with deep inspiration.  Denies fevers, chills, nausea, vomiting, or abdominal pain.  On admission patient was seen moderately tachypneic with respiratory rate fluctuating between mid 30s to 40s, tachycardic with a heart rate of 128, and hypoxic requiring application of nonrebreather.  Lab work significant for sodium 134, K3.4, creatinine 1.16, albumin 2.8, AST 117, ALT 87, LDH 375, troponin XX, lactic acid 2.9, CRP 8.3, D-dimer 0.61, and fibrinogen 628.  Given significant oxygen requirement, positive lactic acidosis, and tachypnea PCCM was consulted for possible need for admission.  On assessment patient is seen alert and oriented, able speak in full sentences, in no obvious  respiratory distress, or displays no severe respiratory fatigue.  Patient was seen stable for admission to hospitalist with PCCM consultation  Past Medical History  None  Significant Hospital Events   Admitted 10/25  Consults:    Procedures:  None  Significant Diagnostic Tests:  CTA chest 10/25 > Limited views due to low lung volumes but no obvious PE seen.  Moderately severe multilobar pneumonia consistent with Covid  Micro Data:  Blood cultures 10/25>  Antimicrobials:  Remdesivir 10/25 >  Interim history/subjective:  Seen lying on ED stretcher in no acute distress.  States she continues to feel mildly dyspneic but improved prior to admission.  Objective   Blood pressure 96/73, pulse (!) 113, temperature 97.8 F (36.6 C), resp. rate (!) 34, height 5\' 9"  (1.753 m), weight 127 kg, SpO2 98 %.        Intake/Output Summary (Last 24 hours) at 11/17/2019 0939 Last data filed at 11/17/2019 0933 Gross per 24 hour  Intake 500 ml  Output --  Net 500 ml   Filed Weights   11/17/19 0505  Weight: 127 kg    Examination: General: Very pleasant adult female lying on ED stretcher in no acute distress. HEENT: [place]/AT, MM pink/moist, PERRL,  Neuro: Alert and oriented x3, nonfocal CV: s1s2 regular rate and rhythm, no murmur, rubs, or gallops,  PULM: Moderate dyspnea, oxygen saturations 96% on nonrebreather, no increased work of breathing or accessory muscle use seen, no signs of respiratory fatigue GI: soft, bowel sounds active in all 4 quadrants, non-tender, non-distended Extremities: warm/dry, no edema  Skin: no rashes or lesions   Resolved Hospital Problem list     Assessment & Plan:   COVID pneumonia  -LDH 375, Ferritin 105, CRP 8.3, Lactic  2.9> 1.6, Procalcitonin <0.10 -Reports she  received Pfizer vaccine April and May of this year Acute hypoxic respiratory failure secondary to Covid pneumonia -Patient reports oxygen saturations of 64 on home SPO2 monitor.  Required  application of nonrebreather on admission to maintain adequate oxygen saturations P: Continue supplemental oxygen via nonrebreather Trial of heated high flow once admitted to floor Continue Decadron Continue remdesivir Encourage self prone VT prophylaxis Cautious IV hydration Given negative procalcitonin no acute indications for antibiotics currently Encourage adequate pulmonary hygiene Patient would want all aggressive interventions applicable Follow culture data Bronchodilators   PCCM will continue to follow peripherally please call for any urgent need for reevaluation.   Best practice:  Diet: Regular diet Pain/Anxiety/Delirium protocol (if indicated): As needed VAP protocol (if indicated): Not applicable DVT prophylaxis: Subcu Lovenox GI prophylaxis: PPI Glucose control: Monitor Mobility: Up with assistance Code Status: Full Family Communication: Per primary Disposition: Per primary  Labs   CBC: Recent Labs  Lab 11/15/19 2031 11/17/19 0506  WBC 3.9* 8.8  NEUTROABS  --  7.9*  HGB 11.3* 11.3*  HCT 35.9* 35.5*  MCV 81.4 82.0  PLT 204 200    Basic Metabolic Panel: Recent Labs  Lab 11/15/19 2031 11/17/19 0506  NA 132* 134*  K 3.2* 3.4*  CL 97* 96*  CO2 24 24  GLUCOSE 112* 128*  BUN 6 8  CREATININE 1.06* 1.16*  CALCIUM 8.4* 8.5*   GFR: Estimated Creatinine Clearance: 95.8 mL/min (A) (by C-G formula based on SCr of 1.16 mg/dL (H)). Recent Labs  Lab 11/15/19 2031 11/17/19 0506 11/17/19 0832  PROCALCITON  --  <0.10  --   WBC 3.9* 8.8  --   LATICACIDVEN  --  2.9* 1.6    Liver Function Tests: Recent Labs  Lab 11/17/19 0506  AST 117*  ALT 87*  ALKPHOS 81  BILITOT 0.9  PROT 7.1  ALBUMIN 2.8*   No results for input(s): LIPASE, AMYLASE in the last 168 hours. No results for input(s): AMMONIA in the last 168 hours.  ABG No results found for: PHART, PCO2ART, PO2ART, HCO3, TCO2, ACIDBASEDEF, O2SAT   Coagulation Profile: No results for input(s):  INR, PROTIME in the last 168 hours.  Cardiac Enzymes: No results for input(s): CKTOTAL, CKMB, CKMBINDEX, TROPONINI in the last 168 hours.  HbA1C: No results found for: HGBA1C  CBG: No results for input(s): GLUCAP in the last 168 hours.  Review of Systems:   Gen: Denies fever, chills, weight change, fatigue, night sweats HEENT: Denies blurred vision, double vision, hearing loss, tinnitus, sinus congestion, rhinorrhea, sore throat, neck stiffness, dysphagia PULM: Denies shortness of breath, cough, sputum production, hemoptysis, wheezing CV: Denies chest pain, edema, orthopnea, paroxysmal nocturnal dyspnea, palpitations GI: Denies abdominal pain, nausea, vomiting, diarrhea, hematochezia, melena, constipation, change in bowel habits GU: Denies dysuria, hematuria, polyuria, oliguria, urethral discharge Endocrine: Denies hot or cold intolerance, polyuria, polyphagia or appetite change Derm: Denies rash, dry skin, scaling or peeling skin change Heme: Denies easy bruising, bleeding, bleeding gums Neuro: Denies headache, numbness, weakness, slurred speech, loss of memory or consciousness  Past Medical History  She,  has a past medical history of Migraine.   Surgical History   No past surgical history on file.   Social History   reports that she has never smoked. She does not have any smokeless tobacco history on file. She reports current alcohol use. She reports that she does not use drugs.   Family History   Her family history is not on file.   Allergies No Known Allergies  Home Medications  Prior to Admission medications   Medication Sig Start Date End Date Taking? Authorizing Provider  oxyCODONE-acetaminophen (PERCOCET/ROXICET) 5-325 MG per tablet Take 1-2 tablets by mouth every 6 (six) hours as needed for severe pain. Patient not taking: Reported on 11/17/2019 07/29/14   Blake Divine, MD  predniSONE (DELTASONE) 50 MG tablet Take 1 tablet (50 mg total) by mouth daily. 11/16/19    Tollie Eth, NP  topiramate (TOPAMAX) 50 MG tablet Take 50 mg by mouth 2 (two) times daily.    [provider]     Signature:   Delfin Gant, NP-C [facility] Contact / Pager information can be found on Amion  11/17/2019, 9:55 AM

## 2019-11-17 NOTE — ED Provider Notes (Signed)
Attestation: Medical screening examination/treatment/procedure(s) were conducted as a shared visit with non-physician practitioner(s) and myself.  I personally evaluated the patient during the encounter.   Briefly, the patient is a 34 y.o. female with h/o COVID infection seen here yesterday by myself for shortness of breath.  She was treated with steroids and albuterol yesterday and reported feeling better.  She was not hypoxic yesterday.  She was set up for MAG infusion.  She returns today for worsening shortness of breath not relieved by the albuterol..   Vitals:   11/17/19 0500 11/17/19 0502  BP:  (!) 142/92  Pulse:  (!) 133  Resp:  (!) 52  Temp:  97.8 F (36.6 C)  SpO2: 94% (!) 15%    CONSTITUTIONAL: Mildly ill-appearing, NAD NEURO:  Alert and oriented x 3, no focal deficits EYES:  pupils equal and reactive ENT/NECK:  trachea midline, no JVD CARDIO: Tacky rate, tacky rhythm, well-perfused PULM:  labored breathing GI/GU:  Abdomen non-distended MSK/SPINE:  No gross deformities, no edema SKIN:  no rash, atraumatic PSYCH:  Appropriate speech and behavior   EKG Interpretation  Date/Time:  Monday November 17 2019 04:59:27 EDT Ventricular Rate:  129 PR Interval:    QRS Duration: 90 QT Interval:  302 QTC Calculation: 443 R Axis:   42 Text Interpretation: Sinus tachycardia Borderline T wave abnormalities When compared with ECG of 11/15/2019, No significant change was found Confirmed by Dione Booze ([id number]) on 11/17/2019 5:15:04 AM       Patient now requires admission for increased work of breathing and hypoxia (40% on RA with Fire) in the setting of COVID-19.  Will be started on remdesivir steroids.  Plan to admit to the hospital for further management.   .Critical Care Performed by: Nira Conn, MD Authorized by: Nira Conn, MD     CRITICAL CARE Performed by: Amadeo Garnet Chen Saadeh Total critical care time: 20 minutes Critical care time was exclusive  of separately billable procedures and treating other patients. Critical care was necessary to treat or prevent imminent or life-threatening deterioration. Critical care was time spent personally by me on the following activities: development of treatment plan with patient and/or surrogate as well as nursing, discussions with consultants, evaluation of patient's response to treatment, examination of patient, obtaining history from patient or surrogate, ordering and performing treatments and interventions, ordering and review of laboratory studies, ordering and review of radiographic studies, pulse oximetry and re-evaluation of patient's condition.      Nira Conn, MD 11/17/19 1935

## 2019-11-17 NOTE — H&P (Signed)
History and Physical    Elizabeth Christian TTS:[id number] DOB: [birth year] DOA: 11/17/2019  I have briefly reviewed the patient's prior medical records in [facility] Link  PCP: Mila Palmer, MD  Patient coming from: home  Chief Complaint: Shortness of breath  HPI: Elizabeth Christian is a 34 y.o. female without significant past medical history other than obesity comes to the hospital with complaints of shortness of breath.  Patient has been feeling sick for the past 5 to 6 days with cough, myalgias, fever, tested positive for COVID-19 on 10/21, was seen in the ED on 10/23 and discharged home with steroids comes to the hospital with complaints of worsening shortness of breath.  Patient reports chest pain with deep breathing.  She denies any nausea, vomiting diarrhea.  She reports that she was vaccinated about 6 months ago with last Pfizer shot in April 2021 and was in the process of getting the booster when she got sick.  She notes that her sats were in the 60s at home before coming to the hospital.  Denies tobacco use, drinks EtOH only socially  ED Course: In the ED she was found to be hypoxic requiring 15 L nonrebreather, she is afebrile, tachypneic with respiratory rate in the 30s, normotensive.  Blood work reveals mild LFT elevation with AST 117 ALT 87, creatinine 1.1, high-sensitivity troponin 20 >> 16, CRP is high at 8.3 and initial lactic acid was 2.9 and improved after IV fluids.  Procalcitonin is negative.  CT angiogram was negative for PE but showed severe multilobar pneumonia.  Critical care was initially consulted but recommended progressive admission and then we are asked to admit    Review of Systems: All systems reviewed, and apart from HPI, all negative  Past Medical History:  Diagnosis Date  . Migraine     No past surgical history on file.   reports that she has never smoked. She does not have any smokeless tobacco history on file. She reports current alcohol use. She  reports that she does not use drugs.  No Known Allergies  Family history reviewed, noncontributory  Prior to Admission medications   Medication Sig Start Date End Date Taking? Authorizing Provider  oxyCODONE-acetaminophen (PERCOCET/ROXICET) 5-325 MG per tablet Take 1-2 tablets by mouth every 6 (six) hours as needed for severe pain. Patient not taking: Reported on 11/17/2019 07/29/14   Blake Divine, MD  predniSONE (DELTASONE) 50 MG tablet Take 1 tablet (50 mg total) by mouth daily. 11/16/19   Tollie Eth, NP  topiramate (TOPAMAX) 50 MG tablet Take 50 mg by mouth 2 (two) times daily.    [provider]    Physical Exam: Vitals:   11/17/19 0645 11/17/19 0646 11/17/19 0700 11/17/19 0730  BP:   (!) 125/94 96/73  Pulse: (!) 110 (!) 115 (!) 115 (!) 113  Resp: (!) 48 (!) 40 (!) 45 (!) 34  Temp:      SpO2: 97% 97% 95% 98%  Weight:      Height:       Constitutional: Appears comfortable, tachypneic Eyes: PERRL, lids and conjunctivae normal ENMT: Mucous membranes are moist. Neck: normal, supple Respiratory: Diffuse bibasilar rhonchi, no wheezing, no crackles.  Increased respiratory effort, tachypneic Cardiovascular: Regular rate and rhythm, no murmurs / rubs / gallops. No extremity edema. Abdomen: no tenderness, no masses palpated. Bowel sounds positive.  Musculoskeletal: no clubbing / cyanosis. Normal muscle tone.  Skin: no rashes, lesions, ulcers. No induration Neurologic: CN 2-12 grossly intact. Strength 5/5 in  all 4.  Psychiatric: Normal judgment and insight. Alert and oriented x 3. Normal mood.   Labs on Admission: I have personally reviewed following labs and imaging studies  CBC: Recent Labs  Lab 11/15/19 2031 11/17/19 0506  WBC 3.9* 8.8  NEUTROABS  --  7.9*  HGB 11.3* 11.3*  HCT 35.9* 35.5*  MCV 81.4 82.0  PLT 204 200   Basic Metabolic Panel: Recent Labs  Lab 11/15/19 2031 11/17/19 0506  NA 132* 134*  K 3.2* 3.4*  CL 97* 96*  CO2 24 24  GLUCOSE 112*  128*  BUN 6 8  CREATININE 1.06* 1.16*  CALCIUM 8.4* 8.5*   Liver Function Tests: Recent Labs  Lab 11/17/19 0506  AST 117*  ALT 87*  ALKPHOS 81  BILITOT 0.9  PROT 7.1  ALBUMIN 2.8*   Coagulation Profile: No results for input(s): INR, PROTIME in the last 168 hours. BNP (last 3 results) No results for input(s): PROBNP in the last 8760 hours. CBG: No results for input(s): GLUCAP in the last 168 hours. Thyroid Function Tests: No results for input(s): TSH, T4TOTAL, FREET4, T3FREE, THYROIDAB in the last 72 hours. Urine analysis: No results found for: COLORURINE, APPEARANCEUR, LABSPEC, PHURINE, GLUCOSEU, HGBUR, BILIRUBINUR, KETONESUR, PROTEINUR, UROBILINOGEN, NITRITE, LEUKOCYTESUR   Radiological Exams on Admission: CT Angio Chest PE W and/or Wo Contrast  Result Date: 11/17/2019 CLINICAL DATA:  Worsening shortness of breath, COVID positive EXAM: CT ANGIOGRAPHY CHEST WITH CONTRAST TECHNIQUE: Multidetector CT imaging of the chest was performed using the standard protocol during bolus administration of intravenous contrast. Multiplanar CT image reconstructions and MIPs were obtained to evaluate the vascular anatomy. CONTRAST:  57mL OMNIPAQUE IOHEXOL 350 MG/ML SOLN COMPARISON:  11/17/2019 FINDINGS: Cardiovascular: Limited with respiratory motion artifact. No significant central or proximal hilar pulmonary artery filling defect or emboli. Limited assessment of the lower lobe peripheral segmental branches because of low lung volumes and respiratory motion. Intact thoracic aorta. Pulsation artifact of the ascending thoracic aorta. No acute dissection or vascular finding. No mediastinal hemorrhage or hematoma. Normal heart size.  No pericardial effusion. Mediastinum/Nodes: No enlarged mediastinal, hilar, or axillary lymph nodes. Thyroid gland, trachea, and esophagus demonstrate no significant findings. Lungs/Pleura: Moderately severe bilateral mixed interstitial and airspace opacities throughout all  lobes of both lungs. Dense bibasilar collapse/consolidation with central air bronchograms. Findings compatible with COVID related pneumonia. Overall low lung volumes. No pleural abnormality, effusion or pneumothorax. Upper Abdomen: No acute upper abdominal finding. Musculoskeletal: No acute osseous finding. Review of the MIP images confirms the above findings. IMPRESSION: No significant central or proximal hilar pulmonary embolus. Limited assessment of the smaller peripheral segmental branches because of low lung volumes and respiratory motion artifact. Moderately severe multilobar pneumonia pattern as above compatible with COVID related pneumonia. Electronically Signed   By: Judie Petit.  Shick M.D.   On: 11/17/2019 08:32   DG Chest Port 1 View  Result Date: 11/17/2019 CLINICAL DATA:  Shortness of breath and COVID EXAM: PORTABLE CHEST 1 VIEW COMPARISON:  Nine days ago FINDINGS: Lower lung volumes with new bilateral hazy infiltrates. No effusion or pneumothorax. Normal heart size. IMPRESSION: Low volume chest with multifocal infiltrate. Electronically Signed   By: Marnee Spring M.D.   On: 11/17/2019 05:21    EKG: Independently reviewed.  Sinus tachycardia  Assessment/Plan  Principal Problem Acute hypoxic respiratory failure due to COVID-19 pneumonia -Patient significantly hypoxic satting in the 60s on room air requiring 15 L nonrebreather.  She was started on remdesivir in the ED, continue.  Continue steroids -  Patient will be given Actemra, the patient has hypoxia and is high-risk for intubation, but expected to survive >48 hours and has good baseline functional status.  She is not known to be on immunomodulators, anti-rejection medications, or cancer chemotherapy, has no history of TB or latent TB, and no history of diverticulitis or intestinal perforation.  Platelets are >50K, ANC is >500, and ALT/AST are below 5x ULN with no known hepatitis B infection.  Patient consents to use -OK with mild hypoxemia,  goal at rest is > 85% SaO2, with movement ideally > 75% -encouraged the patient to sit up in chair in the daytime use I-S and flutter valve for pulmonary toiletry and then prone in bed when at night.  Active Problems Obesity -Based on a BMI of 41, patient would benefit from weight loss.  Given obesity and steroid use, will place on sliding scale   DVT prophylaxis: Lovenox  Code Status: Full code  Family Communication: d/w patient  Disposition Plan: home when ready  Bed Type: progressive  Consults called: PCCM by EDP  Obs/Inp: Inpatient   At the time of admission, it appears that the appropriate admission status for this patient is INPATIENT as it is expected that patient will require hospital care > 2 midnights. This is judged to be reasonable and necessary in order to provide the required intensity of service to ensure the patient's safety given: presenting symptoms, initial radiographic and laboratory data and in the context of their chronic comorbidities. Together, these circumstances are felt to place patient at high at high risk for further clinical deterioration threatening life, limb, or organ.  Pamella Pert, MD, PhD [facility]  Contact via [email or web address]  11/17/2019, 9:48 AM

## 2019-11-17 NOTE — ED Notes (Addendum)
Date and time results received: 11/17/19 0600 (use smartphrase ".now" to insert current time)  Test: Lactic Critical Value: 2.9  Name of Provider Notified: [place]

## 2019-11-18 ENCOUNTER — Encounter (HOSPITAL_COMMUNITY): Payer: Self-pay | Admitting: Internal Medicine

## 2019-11-18 LAB — CBC WITH DIFFERENTIAL/PLATELET
Abs Immature Granulocytes: 0.14 10*3/uL — ABNORMAL HIGH (ref 0.00–0.07)
Basophils Absolute: 0 10*3/uL (ref 0.0–0.1)
Basophils Relative: 0 %
Eosinophils Absolute: 0 10*3/uL (ref 0.0–0.5)
Eosinophils Relative: 0 %
HCT: 32.3 % — ABNORMAL LOW (ref 36.0–46.0)
Hemoglobin: 10.2 g/dL — ABNORMAL LOW (ref 12.0–15.0)
Immature Granulocytes: 1 %
Lymphocytes Relative: 6 %
Lymphs Abs: 0.7 10*3/uL (ref 0.7–4.0)
MCH: 25.8 pg — ABNORMAL LOW (ref 26.0–34.0)
MCHC: 31.6 g/dL (ref 30.0–36.0)
MCV: 81.6 fL (ref 80.0–100.0)
Monocytes Absolute: 0.5 10*3/uL (ref 0.1–1.0)
Monocytes Relative: 4 %
Neutro Abs: 11.3 10*3/uL — ABNORMAL HIGH (ref 1.7–7.7)
Neutrophils Relative %: 89 %
Platelets: 250 10*3/uL (ref 150–400)
RBC: 3.96 MIL/uL (ref 3.87–5.11)
RDW: 15.3 % (ref 11.5–15.5)
WBC: 12.7 10*3/uL — ABNORMAL HIGH (ref 4.0–10.5)
nRBC: 0 % (ref 0.0–0.2)

## 2019-11-18 LAB — COMPREHENSIVE METABOLIC PANEL
ALT: 65 U/L — ABNORMAL HIGH (ref 0–44)
AST: 57 U/L — ABNORMAL HIGH (ref 15–41)
Albumin: 2.4 g/dL — ABNORMAL LOW (ref 3.5–5.0)
Alkaline Phosphatase: 70 U/L (ref 38–126)
Anion gap: 10 (ref 5–15)
BUN: 11 mg/dL (ref 6–20)
CO2: 26 mmol/L (ref 22–32)
Calcium: 8.5 mg/dL — ABNORMAL LOW (ref 8.9–10.3)
Chloride: 103 mmol/L (ref 98–111)
Creatinine, Ser: 0.92 mg/dL (ref 0.44–1.00)
GFR, Estimated: >60 mL/min (ref >60)
Glucose, Bld: 130 mg/dL — ABNORMAL HIGH (ref 70–99)
Potassium: 4.1 mmol/L (ref 3.5–5.1)
Sodium: 139 mmol/L (ref 135–145)
Total Bilirubin: 0.7 mg/dL (ref 0.3–1.2)
Total Protein: 6.3 g/dL — ABNORMAL LOW (ref 6.5–8.1)

## 2019-11-18 LAB — BLOOD CULTURE ID PANEL (REFLEXED) - BCID2

## 2019-11-18 LAB — HIV ANTIBODY (ROUTINE TESTING W REFLEX): HIV Screen 4th Generation wRfx: NONREACTIVE

## 2019-11-18 LAB — C-REACTIVE PROTEIN: CRP: 17.6 mg/dL — ABNORMAL HIGH (ref <1.0)

## 2019-11-18 LAB — GLUCOSE, CAPILLARY
Glucose-Capillary: 137 mg/dL — ABNORMAL HIGH (ref 70–99)
Glucose-Capillary: 135 mg/dL — ABNORMAL HIGH (ref 70–99)
Glucose-Capillary: 155 mg/dL — ABNORMAL HIGH (ref 70–99)
Glucose-Capillary: 132 mg/dL — ABNORMAL HIGH (ref 70–99)

## 2019-11-18 LAB — D-DIMER, QUANTITATIVE: D-Dimer, Quant: 0.31 ug/mL-FEU (ref 0.00–0.50)

## 2019-11-18 MED ORDER — METHYLPREDNISOLONE SODIUM SUCC 125 MG IJ SOLR
60.0000 mg | Freq: Three times a day (TID) | INTRAMUSCULAR | Status: DC
  Administered 2019-11-18 – 2019-11-21 (×11): 60 mg via INTRAVENOUS
  Filled 2019-11-18 (×11): qty 2

## 2019-11-18 MED ORDER — PANTOPRAZOLE SODIUM 40 MG PO TBEC
40.0000 mg | DELAYED_RELEASE_TABLET | Freq: Every day | ORAL | Status: DC
  Administered 2019-11-18 – 2019-11-21 (×4): 40 mg via ORAL
  Filled 2019-11-18 (×4): qty 1

## 2019-11-18 MED ORDER — WHITE PETROLATUM EX OINT
TOPICAL_OINTMENT | CUTANEOUS | Status: AC
  Filled 2019-11-18: qty 28.35

## 2019-11-18 MED ORDER — POLYVINYL ALCOHOL 1.4 % OP SOLN
2.0000 [drp] | OPHTHALMIC | Status: DC | PRN
  Administered 2019-11-18: 2 [drp] via OPHTHALMIC
  Filled 2019-11-18: qty 15

## 2019-11-18 NOTE — Progress Notes (Signed)
PROGRESS NOTE                                                                             PROGRESS NOTE                                                                                                                                                                                                             Patient Demographics:    Elizabeth Christian, is a 34 y.o. female, DOB - December 02, 1985, ZOX:[id number]  Outpatient Primary MD for the patient is Mila Palmer, MD    LOS - 1  Admit date - 11/17/2019    Chief Complaint  Patient presents with  . Covid Positive  . Shortness of Breath       Brief Narrative    HPI: Elizabeth Christian is a 34 y.o. female without significant past medical history other than obesity comes to the hospital with complaints of shortness of breath.  Patient has been feeling sick for the past 5 to 6 days with cough, myalgias, fever, tested positive for COVID-19 on 10/21, was seen in the ED on 10/23 and discharged home with steroids comes to the hospital with complaints of worsening shortness of breath.  Patient reports chest pain with deep breathing.  She denies any nausea, vomiting diarrhea.  She reports that she was vaccinated about 6 months ago with last Pfizer shot in April 2021 and was in the process of getting the booster when she got sick.  She notes that her sats were in the 60s at home before coming to the hospital.  Denies tobacco use, drinks EtOH only socially  ED Course: In the ED she was found to be hypoxic requiring 15 L nonrebreather, she is afebrile, tachypneic with respiratory rate in the 30s, normotensive.  Blood work reveals mild LFT elevation with AST 117 ALT 87, creatinine 1.1, high-sensitivity troponin 20 >> 16, CRP is high at 8.3 and initial lactic acid was 2.9 and improved after IV fluids.  Procalcitonin is negative.  CT angiogram was negative  for PE but showed severe multilobar pneumonia.  Critical care was  initially consulted but recommended progressive admission and then we are asked to admit   Subjective:    Elizabeth Christian today reports her dyspnea has improved, still complains of cough, no chest pain, no fever or chills.      Assessment  & Plan :    Active Problems:   Acute hypoxemic respiratory failure due to COVID-19 [facility])  Acute Hypoxic Resp. Failure due to Acute Covid 19 Viral Pneumonitis during the ongoing 2020 Covid 19 Pandemic  -She is unvaccinated, appears to be breakthrough infection, unfortunately she presents with severe COVID-19 of pneumonia. -She remains on 15 L high flow nasal cannula. -Continue with IV steroids, have increased her dose to 60 mg IV every 8 hours, continue with PPI meanwhile. -Continue with IV remdesivir, monitor LFTs closely. -Received Actemra on admission 10/25. -Continue to trend inflammatory markers. -Procalcitonin within normal limits, no indication for antibiotics  Encouraged the patient to sit up in chair in the daytime use I-S and flutter valve for pulmonary toiletry and then prone in bed when at night.  Will advance activity and titrate down oxygen as possible.  1/2 blood cultures growing Staph epidermidis -likely contaminant, no indication to treat   Obesity -Based on a BMI of 41, patient would benefit from weight loss.  Given obesity and steroid use, will place on sliding scale  SpO2: (!) 88 % O2 Flow Rate (L/min): 15 L/min  Recent Labs  Lab 11/15/19 2031 11/17/19 0506 11/17/19 0520 11/17/19 0832 11/18/19 0158  WBC 3.9* 8.8  --   --  12.7*  PLT 204 200  --   --  250  CRP  --   --  8.3*  --  17.6*  DDIMER  --  0.61*  --   --  0.31  PROCALCITON  --  <0.10  --   --   --   AST  --  117*  --   --  57*  ALT  --  87*  --   --  65*  ALKPHOS  --  81  --   --  70  BILITOT  --  0.9  --   --  0.7  ALBUMIN  --  2.8*  --   --  2.4*  LATICACIDVEN  --  2.9*  --  1.6  --        ABG  No results found for: PHART, PCO2ART, PO2ART, HCO3,  TCO2, ACIDBASEDEF, O2SAT       Condition - Extremely Guarded  Family Communication  : I have offered to update family, but patient reports she will update herself, all her questions were answered .  Code Status :  Full  Consults  :  none  Procedures  :  none  PUD Prophylaxis : protonix  Disposition Plan  :    Status is: Inpatient  Remains inpatient appropriate because:Hemodynamically unstable and IV treatments appropriate due to intensity of illness or inability to take PO   Dispo: The patient is from: Home              Anticipated d/c is to: Home              Anticipated d/c date is: > 3 days              Patient currently is not medically stable to d/c.      DVT Prophylaxis  :  Lovenox   Lab Results  Component  Value Date   PLT 250 11/18/2019    Diet :  Diet Order            Diet regular Room service appropriate? Yes; Fluid consistency: Thin  Diet effective now                  Inpatient Medications  Scheduled Meds: . enoxaparin (LOVENOX) injection  65 mg Subcutaneous Q24H  . insulin aspart  0-9 Units Subcutaneous TID WC  . methylPREDNISolone (SOLU-MEDROL) injection  60 mg Intravenous Q8H  . pantoprazole  40 mg Oral Daily  . sodium chloride flush  3 mL Intravenous Q12H   Continuous Infusions: . sodium chloride    . remdesivir 100 mg in NS 100 mL 100 mg (11/18/19 0901)   PRN Meds:.sodium chloride, polyvinyl alcohol, sodium chloride flush  Antibiotics  :    Anti-infectives (From admission, onward)   Start     Dose/Rate Route Frequency Ordered Stop   11/18/19 1000  remdesivir 100 mg in sodium chloride 0.9 % 100 mL IVPB        100 mg 200 mL/hr over 30 Minutes Intravenous Daily 11/17/19 0517 11/22/19 0959   11/18/19 1000  remdesivir 100 mg in sodium chloride 0.9 % 100 mL IVPB  Status:  Discontinued       "Followed by" Linked Group Details   100 mg 200 mL/hr over 30 Minutes Intravenous Daily 11/17/19 1157 11/17/19 1205   11/17/19 1157   remdesivir 200 mg in sodium chloride 0.9% 250 mL IVPB  Status:  Discontinued       "Followed by" Linked Group Details   200 mg 580 mL/hr over 30 Minutes Intravenous Once 11/17/19 1157 11/17/19 1211   11/17/19 0600  remdesivir 100 mg in sodium chloride 0.9 % 100 mL IVPB        100 mg 200 mL/hr over 30 Minutes Intravenous Every 30 min 11/17/19 0517 11/17/19 1213       Time Spent in minutes  30   Huey Bienenstockawood Ivianna Notch M.D on 11/18/2019 at 1:40 PM  To page go to [email or web address]   [facility] -  Office  [phone]        Objective:   Vitals:   11/17/19 2330 11/18/19 0544 11/18/19 0733 11/18/19 1300  BP: 121/78 111/77 115/74 118/87  Pulse: 100 100 96 99  Resp: 18 20    Temp: 98.7 F (37.1 C) 98.5 F (36.9 C) 98 F (36.7 C) (!) 97.4 F (36.3 C)  TempSrc: Oral Oral Oral Oral  SpO2: 92% 90% 92% (!) 88%  Weight:      Height:        Wt Readings from Last 3 Encounters:  11/17/19 130.1 kg  11/15/19 123.4 kg  07/29/14 123.4 kg     Intake/Output Summary (Last 24 hours) at 11/18/2019 1340 Last data filed at 11/18/2019 0544 Gross per 24 hour  Intake 637.16 ml  Output 350 ml  Net 287.16 ml     Physical Exam  Awake Alert, No new F.N deficits, Normal affect Symmetrical Chest wall movement, Good air movement bilaterally, CTAB RRR,No Gallops,Rubs or new Murmurs, No Parasternal Heave +ve B.Sounds, Abd Soft, No tenderness, No rebound - guarding or rigidity. No Cyanosis, Clubbing or edema, No new Rash or bruise      Data Review:    CBC Recent Labs  Lab 11/15/19 2031 11/17/19 0506 11/18/19 0158  WBC 3.9* 8.8 12.7*  HGB 11.3* 11.3* 10.2*  HCT 35.9* 35.5* 32.3*  PLT 204 200 250  MCV 81.4 82.0 81.6  MCH 25.6* 26.1 25.8*  MCHC 31.5 31.8 31.6  RDW 14.8 15.1 15.3  LYMPHSABS  --  0.6* 0.7  MONOABS  --  0.2 0.5  EOSABS  --  0.0 0.0  BASOSABS  --  0.0 0.0    Recent Labs  Lab 11/15/19 2031 11/17/19 0506 11/17/19 0520 11/17/19 0832 11/18/19 0158  NA 132*  134*  --   --  139  K 3.2* 3.4*  --   --  4.1  CL 97* 96*  --   --  103  CO2 24 24  --   --  26  GLUCOSE 112* 128*  --   --  130*  BUN 6 8  --   --  11  CREATININE 1.06* 1.16*  --   --  0.92  CALCIUM 8.4* 8.5*  --   --  8.5*  AST  --  117*  --   --  57*  ALT  --  87*  --   --  65*  ALKPHOS  --  81  --   --  70  BILITOT  --  0.9  --   --  0.7  ALBUMIN  --  2.8*  --   --  2.4*  CRP  --   --  8.3*  --  17.6*  DDIMER  --  0.61*  --   --  0.31  PROCALCITON  --  <0.10  --   --   --   LATICACIDVEN  --  2.9*  --  1.6  --     ------------------------------------------------------------------------------------------------------------------ Recent Labs    11/17/19 0506  TRIG 107    No results found for: HGBA1C ------------------------------------------------------------------------------------------------------------------ No results for input(s): TSH, T4TOTAL, T3FREE, THYROIDAB in the last 72 hours.  Invalid input(s): FREET3  Cardiac Enzymes No results for input(s): CKMB, TROPONINI, MYOGLOBIN in the last 168 hours.  Invalid input(s): CK ------------------------------------------------------------------------------------------------------------------ No results found for: BNP  Micro Results Recent Results (from the past 240 hour(s))  Blood Culture (routine x 2)     Status: None (Preliminary result)   Collection Time: 11/17/19  5:40 AM   Specimen: BLOOD  Result Value Ref Range Status   Specimen Description BLOOD RIGHT ANTECUBITAL  Final   Special Requests   Final    BOTTLES DRAWN AEROBIC AND ANAEROBIC Blood Culture results may not be optimal due to an excessive volume of blood received in culture bottles   Culture   Final    NO GROWTH 1 DAY Performed at [facility], [address] [place], [place] [address]    Report Status PENDING  Incomplete  Blood Culture (routine x 2)     Status: Abnormal (Preliminary result)   Collection Time: 11/17/19  5:50 AM   Specimen:  BLOOD LEFT FOREARM  Result Value Ref Range Status   Specimen Description BLOOD LEFT FOREARM  Final   Special Requests   Final    BOTTLES DRAWN AEROBIC AND ANAEROBIC Blood Culture results may not be optimal due to an inadequate volume of blood received in culture bottles   Culture  Setup Time   Final    GRAM POSITIVE COCCI IN BOTH AEROBIC AND ANAEROBIC BOTTLES CRITICAL RESULT CALLED TO, READ BACK BY AND VERIFIED WITH: PHARMD G ABBOTT 11/18/19 AT 0010 SK    Culture (A)  Final    STAPHYLOCOCCUS EPIDERMIDIS THE SIGNIFICANCE OF ISOLATING THIS ORGANISM FROM A SINGLE SET OF BLOOD CULTURES WHEN MULTIPLE SETS ARE DRAWN  IS UNCERTAIN. PLEASE NOTIFY THE MICROBIOLOGY DEPARTMENT WITHIN ONE WEEK IF SPECIATION AND SENSITIVITIES ARE REQUIRED. Performed at [facility], [address] [place], [place] [address]    Report Status PENDING  Incomplete  Blood Culture ID Panel (Reflexed)     Status: Abnormal   Collection Time: 11/17/19  5:50 AM  Result Value Ref Range Status   Enterococcus faecalis NOT DETECTED NOT DETECTED Final   Enterococcus Faecium NOT DETECTED NOT DETECTED Final   Listeria monocytogenes NOT DETECTED NOT DETECTED Final   Staphylococcus species DETECTED (A) NOT DETECTED Final    Comment: CRITICAL RESULT CALLED TO, READ BACK BY AND VERIFIED WITH: PHARMD G ABBOTT 11/18/19 AT 0010 SK    Staphylococcus aureus (BCID) NOT DETECTED NOT DETECTED Final   Staphylococcus epidermidis DETECTED (A) NOT DETECTED Final    Comment: CRITICAL RESULT CALLED TO, READ BACK BY AND VERIFIED WITH: PHARMD G ABBOTT 11/18/19 AT 0010 SK    Staphylococcus lugdunensis NOT DETECTED NOT DETECTED Final   Streptococcus species NOT DETECTED NOT DETECTED Final   Streptococcus agalactiae NOT DETECTED NOT DETECTED Final   Streptococcus pneumoniae NOT DETECTED NOT DETECTED Final   Streptococcus pyogenes NOT DETECTED NOT DETECTED Final   A.calcoaceticus-baumannii NOT DETECTED NOT DETECTED Final   Bacteroides fragilis  NOT DETECTED NOT DETECTED Final   Enterobacterales NOT DETECTED NOT DETECTED Final   Enterobacter cloacae complex NOT DETECTED NOT DETECTED Final   Escherichia coli NOT DETECTED NOT DETECTED Final   Klebsiella aerogenes NOT DETECTED NOT DETECTED Final   Klebsiella oxytoca NOT DETECTED NOT DETECTED Final   Klebsiella pneumoniae NOT DETECTED NOT DETECTED Final   Proteus species NOT DETECTED NOT DETECTED Final   Salmonella species NOT DETECTED NOT DETECTED Final   Serratia marcescens NOT DETECTED NOT DETECTED Final   Haemophilus influenzae NOT DETECTED NOT DETECTED Final   Neisseria meningitidis NOT DETECTED NOT DETECTED Final   Pseudomonas aeruginosa NOT DETECTED NOT DETECTED Final   Stenotrophomonas maltophilia NOT DETECTED NOT DETECTED Final   Candida albicans NOT DETECTED NOT DETECTED Final   Candida auris NOT DETECTED NOT DETECTED Final   Candida glabrata NOT DETECTED NOT DETECTED Final   Candida krusei NOT DETECTED NOT DETECTED Final   Candida parapsilosis NOT DETECTED NOT DETECTED Final   Candida tropicalis NOT DETECTED NOT DETECTED Final   Cryptococcus neoformans/gattii NOT DETECTED NOT DETECTED Final   Methicillin resistance mecA/C NOT DETECTED NOT DETECTED Final    Comment: Performed at [facility], [address] [place], [place] [address]  MRSA PCR Screening     Status: None   Collection Time: 11/17/19  5:14 PM   Specimen: Nasal Mucosa; Nasopharyngeal  Result Value Ref Range Status   MRSA by PCR NEGATIVE NEGATIVE Final    Comment:        The GeneXpert MRSA Assay (FDA approved for NASAL specimens only), is one component of a comprehensive MRSA colonization surveillance program. It is not intended to diagnose MRSA infection nor to guide or monitor treatment for MRSA infections. Performed at [facility], [address] [place], [place] [address]     Radiology Reports CT Angio Chest PE W and/or Wo Contrast  Result Date: 11/17/2019 CLINICAL DATA:   Worsening shortness of breath, COVID positive EXAM: CT ANGIOGRAPHY CHEST WITH CONTRAST TECHNIQUE: Multidetector CT imaging of the chest was performed using the standard protocol during bolus administration of intravenous contrast. Multiplanar CT image reconstructions and MIPs were obtained to evaluate the vascular anatomy. CONTRAST:  50mL  OMNIPAQUE IOHEXOL 350 MG/ML SOLN COMPARISON:  11/17/2019 FINDINGS: Cardiovascular: Limited with respiratory motion artifact. No significant central or proximal hilar pulmonary artery filling defect or emboli. Limited assessment of the lower lobe peripheral segmental branches because of low lung volumes and respiratory motion. Intact thoracic aorta. Pulsation artifact of the ascending thoracic aorta. No acute dissection or vascular finding. No mediastinal hemorrhage or hematoma. Normal heart size.  No pericardial effusion. Mediastinum/Nodes: No enlarged mediastinal, hilar, or axillary lymph nodes. Thyroid gland, trachea, and esophagus demonstrate no significant findings. Lungs/Pleura: Moderately severe bilateral mixed interstitial and airspace opacities throughout all lobes of both lungs. Dense bibasilar collapse/consolidation with central air bronchograms. Findings compatible with COVID related pneumonia. Overall low lung volumes. No pleural abnormality, effusion or pneumothorax. Upper Abdomen: No acute upper abdominal finding. Musculoskeletal: No acute osseous finding. Review of the MIP images confirms the above findings. IMPRESSION: No significant central or proximal hilar pulmonary embolus. Limited assessment of the smaller peripheral segmental branches because of low lung volumes and respiratory motion artifact. Moderately severe multilobar pneumonia pattern as above compatible with COVID related pneumonia. Electronically Signed   By: Judie Petit.  Shick M.D.   On: 11/17/2019 08:32   DG Chest Port 1 View  Result Date: 11/17/2019 CLINICAL DATA:  Shortness of breath and COVID EXAM:  PORTABLE CHEST 1 VIEW COMPARISON:  Nine days ago FINDINGS: Lower lung volumes with new bilateral hazy infiltrates. No effusion or pneumothorax. Normal heart size. IMPRESSION: Low volume chest with multifocal infiltrate. Electronically Signed   By: Marnee Spring M.D.   On: 11/17/2019 05:21

## 2019-11-18 NOTE — Progress Notes (Signed)
PHARMACY - PHYSICIAN COMMUNICATION CRITICAL VALUE ALERT - BLOOD CULTURE IDENTIFICATION (BCID)  Elizabeth Christian is an 34 y.o. female who presented to [facility] on 11/17/2019 with a chief complaint of COVID PNA  Assessment:   1/2 blood cultures growing Staph epidermidis--likely contaminant  Name of physician (or Provider) Contacted:  Dr. Toniann Fail  Current antibiotics: None  Changes to prescribed antibiotics recommended:  None at this time  Results for orders placed or performed during the hospital encounter of 11/17/19  Blood Culture ID Panel (Reflexed) (Collected: 11/17/2019  5:50 AM)  Result Value Ref Range   Enterococcus faecalis NOT DETECTED NOT DETECTED   Enterococcus Faecium NOT DETECTED NOT DETECTED   Listeria monocytogenes NOT DETECTED NOT DETECTED   Staphylococcus species DETECTED (A) NOT DETECTED   Staphylococcus aureus (BCID) NOT DETECTED NOT DETECTED   Staphylococcus epidermidis DETECTED (A) NOT DETECTED   Staphylococcus lugdunensis NOT DETECTED NOT DETECTED   Streptococcus species NOT DETECTED NOT DETECTED   Streptococcus agalactiae NOT DETECTED NOT DETECTED   Streptococcus pneumoniae NOT DETECTED NOT DETECTED   Streptococcus pyogenes NOT DETECTED NOT DETECTED   A.calcoaceticus-baumannii NOT DETECTED NOT DETECTED   Bacteroides fragilis NOT DETECTED NOT DETECTED   Enterobacterales NOT DETECTED NOT DETECTED   Enterobacter cloacae complex NOT DETECTED NOT DETECTED   Escherichia coli NOT DETECTED NOT DETECTED   Klebsiella aerogenes NOT DETECTED NOT DETECTED   Klebsiella oxytoca NOT DETECTED NOT DETECTED   Klebsiella pneumoniae NOT DETECTED NOT DETECTED   Proteus species NOT DETECTED NOT DETECTED   Salmonella species NOT DETECTED NOT DETECTED   Serratia marcescens NOT DETECTED NOT DETECTED   Haemophilus influenzae NOT DETECTED NOT DETECTED   Neisseria meningitidis NOT DETECTED NOT DETECTED   Pseudomonas aeruginosa NOT DETECTED NOT DETECTED   Stenotrophomonas  maltophilia NOT DETECTED NOT DETECTED   Candida albicans NOT DETECTED NOT DETECTED   Candida auris NOT DETECTED NOT DETECTED   Candida glabrata NOT DETECTED NOT DETECTED   Candida krusei NOT DETECTED NOT DETECTED   Candida parapsilosis NOT DETECTED NOT DETECTED   Candida tropicalis NOT DETECTED NOT DETECTED   Cryptococcus neoformans/gattii NOT DETECTED NOT DETECTED   Methicillin resistance mecA/C NOT DETECTED NOT DETECTED    Eddie Candle 11/18/2019  12:48 AM

## 2019-11-19 ENCOUNTER — Encounter (HOSPITAL_COMMUNITY): Payer: Self-pay | Admitting: Internal Medicine

## 2019-11-19 LAB — GLUCOSE, CAPILLARY
Glucose-Capillary: 105 mg/dL — ABNORMAL HIGH (ref 70–99)
Glucose-Capillary: 112 mg/dL — ABNORMAL HIGH (ref 70–99)
Glucose-Capillary: 123 mg/dL — ABNORMAL HIGH (ref 70–99)
Glucose-Capillary: 116 mg/dL — ABNORMAL HIGH (ref 70–99)

## 2019-11-19 LAB — CULTURE, BLOOD (ROUTINE X 2)

## 2019-11-19 LAB — COMPREHENSIVE METABOLIC PANEL
ALT: 83 U/L — ABNORMAL HIGH (ref 0–44)
AST: 71 U/L — ABNORMAL HIGH (ref 15–41)
Albumin: 2.4 g/dL — ABNORMAL LOW (ref 3.5–5.0)
Alkaline Phosphatase: 75 U/L (ref 38–126)
Anion gap: 13 (ref 5–15)
BUN: 18 mg/dL (ref 6–20)
CO2: 22 mmol/L (ref 22–32)
Calcium: 8.5 mg/dL — ABNORMAL LOW (ref 8.9–10.3)
Chloride: 103 mmol/L (ref 98–111)
Creatinine, Ser: 0.93 mg/dL (ref 0.44–1.00)
GFR, Estimated: >60 mL/min (ref >60)
Glucose, Bld: 126 mg/dL — ABNORMAL HIGH (ref 70–99)
Potassium: 4.5 mmol/L (ref 3.5–5.1)
Sodium: 138 mmol/L (ref 135–145)
Total Bilirubin: 0.7 mg/dL (ref 0.3–1.2)
Total Protein: 6.4 g/dL — ABNORMAL LOW (ref 6.5–8.1)

## 2019-11-19 LAB — CBC WITH DIFFERENTIAL/PLATELET
Abs Immature Granulocytes: 0.11 10*3/uL — ABNORMAL HIGH (ref 0.00–0.07)
Basophils Absolute: 0 10*3/uL (ref 0.0–0.1)
Basophils Relative: 0 %
Eosinophils Absolute: 0 10*3/uL (ref 0.0–0.5)
Eosinophils Relative: 0 %
HCT: 32.8 % — ABNORMAL LOW (ref 36.0–46.0)
Hemoglobin: 10 g/dL — ABNORMAL LOW (ref 12.0–15.0)
Immature Granulocytes: 1 %
Lymphocytes Relative: 7 %
Lymphs Abs: 0.8 10*3/uL (ref 0.7–4.0)
MCH: 25.7 pg — ABNORMAL LOW (ref 26.0–34.0)
MCHC: 30.5 g/dL (ref 30.0–36.0)
MCV: 84.3 fL (ref 80.0–100.0)
Monocytes Absolute: 0.6 10*3/uL (ref 0.1–1.0)
Monocytes Relative: 5 %
Neutro Abs: 10.2 10*3/uL — ABNORMAL HIGH (ref 1.7–7.7)
Neutrophils Relative %: 87 %
Platelets: 216 10*3/uL (ref 150–400)
RBC: 3.89 MIL/uL (ref 3.87–5.11)
RDW: 15.5 % (ref 11.5–15.5)
WBC: 11.7 10*3/uL — ABNORMAL HIGH (ref 4.0–10.5)
nRBC: 0 % (ref 0.0–0.2)

## 2019-11-19 LAB — D-DIMER, QUANTITATIVE: D-Dimer, Quant: 0.37 ug/mL-FEU (ref 0.00–0.50)

## 2019-11-19 LAB — C-REACTIVE PROTEIN: CRP: 7.3 mg/dL — ABNORMAL HIGH (ref <1.0)

## 2019-11-19 NOTE — Progress Notes (Signed)
SATURATION QUALIFICATIONS: (This note is used to comply with regulatory documentation for home oxygen)  Patient Saturations on Room Air at Rest = 78  Patient Saturations on Room Air while Ambulating = not tested  Patient Saturations on 6 Liters of oxygen while Ambulating = 88%  Please briefly explain why patient needs home oxygen:

## 2019-11-19 NOTE — Progress Notes (Signed)
Patient ambulated 150 feet in hallway. Patient comfortable throughout walk despite requiring 6L o2 to maintain saturations around 88%. No increased work of breathing or dyspnea noted. Patient returned back to chair and placed back on 3L [place] with saturations in low 90's.

## 2019-11-19 NOTE — Progress Notes (Signed)
PROGRESS NOTE                                                                                                                                                                                                             Patient Demographics:    Elizabeth Christian, is a 34 y.o. female, DOB - 04/07/1985, UUV:[id number]  Outpatient Primary MD for the patient is Mila Palmer, MD   Admit date - 11/17/2019   LOS - 2  Chief Complaint  Patient presents with   Covid Positive   Shortness of Breath       Brief Narrative: Patient is a 34 y.o. female with no PMHx of presented with severe hypoxemia due to breakthrough COVID-19 infection (fully vaccinated)  COVID-19 vaccinated status: Vaccinated  Significant Events: 10/25>> Admit to [facility] for severe hypoxemia due to COVID-19 pneumonia  Significant studies: 10/25>> CTA chest: No PE, multi lobar pneumonia  COVID-19 medications: Steroids: 10/23>> Remdesivir: 10/24>> Actemra>> 10/25 x 1  Antibiotics: None  Microbiology data: 10/25 blood culture>> 1/2 staph epidermidis  Procedures: None  Consults: None  DVT prophylaxis: Prophylactic Lovenox    Subjective:    Elizabeth Christian today feels much better-titrate down to 3 L of oxygen.   Assessment  & Plan :   Acute Hypoxic Resp Failure due to Covid 19 Viral pneumonia: Had severe hypoxia  when she first presented but rapidly improving-Down to 3 L of oxygen.  Continue steroids/remdesivir-continue to attempt to titrate down FiO2.  May require home O2 on discharge.  Fever: afebrile O2 requirements:  SpO2: 91 % O2 Flow Rate (L/min): 4 L/min   COVID-19 Labs: Recent Labs    11/17/19 0506 11/17/19 0520 11/18/19 0158 11/19/19 0059  DDIMER 0.61*  --  0.31 0.37  FERRITIN  --  105  --   --   LDH 375*  --   --   --   CRP  --  8.3* 17.6* 7.3*    No results found for: BNP  Recent Labs  Lab 11/17/19 0506    PROCALCITON <0.10    No results found for: SARSCOV2NAA   Prone/Incentive Spirometry: encouraged  incentive spirometry use 3-4/hour.  Transaminitis: Secondary to COVID-19-mild-continue monitoring closely.  Morbid Obesity: Estimated body mass index is 42.36 kg/m as calculated from the following:   Height as of this encounter: 5\' 9"  (1.753 m).  Weight as of this encounter: 130.1 kg.    ABG: No results found for: PHART, PCO2ART, PO2ART, HCO3, TCO2, ACIDBASEDEF, O2SAT  Vent Settings: N/A  Condition - Stable  Family Communication  : Patient to update family herself.  Code Status :  Full Code  Diet :  Diet Order            Diet regular Room service appropriate? Yes; Fluid consistency: Thin  Diet effective now                  Disposition Plan  :   Status is: Inpatient  Remains inpatient appropriate because:Inpatient level of care appropriate due to severity of illness   Dispo: The patient is from: Home              Anticipated d/c is to: Home              Anticipated d/c date is: 2 days              Patient currently is not medically stable to d/c.   Barriers to discharge: Hypoxia requiring O2 supplementation/complete 5 days of IV Remdesivir  Antimicorbials  :    Anti-infectives (From admission, onward)   Start     Dose/Rate Route Frequency Ordered Stop   11/18/19 1000  remdesivir 100 mg in sodium chloride 0.9 % 100 mL IVPB        100 mg 200 mL/hr over 30 Minutes Intravenous Daily 11/17/19 0517 11/22/19 0959   11/18/19 1000  remdesivir 100 mg in sodium chloride 0.9 % 100 mL IVPB  Status:  Discontinued       "Followed by" Linked Group Details   100 mg 200 mL/hr over 30 Minutes Intravenous Daily 11/17/19 1157 11/17/19 1205   11/17/19 1157  remdesivir 200 mg in sodium chloride 0.9% 250 mL IVPB  Status:  Discontinued       "Followed by" Linked Group Details   200 mg 580 mL/hr over 30 Minutes Intravenous Once 11/17/19 1157 11/17/19 1211   11/17/19 0600   remdesivir 100 mg in sodium chloride 0.9 % 100 mL IVPB        100 mg 200 mL/hr over 30 Minutes Intravenous Every 30 min 11/17/19 0517 11/17/19 1213      Inpatient Medications  Scheduled Meds:  enoxaparin (LOVENOX) injection  65 mg Subcutaneous Q24H   insulin aspart  0-9 Units Subcutaneous TID WC   methylPREDNISolone (SOLU-MEDROL) injection  60 mg Intravenous Q8H   pantoprazole  40 mg Oral Daily   sodium chloride flush  3 mL Intravenous Q12H   Continuous Infusions:  sodium chloride     remdesivir 100 mg in NS 100 mL 100 mg (11/19/19 0829)   PRN Meds:.sodium chloride, polyvinyl alcohol, sodium chloride flush   Time Spent in minutes  25  See all Orders from today for further details   Jeoffrey Massed M.D on 11/19/2019 at 11:41 AM  To page go to [email or web address] - use universal password  [facility] -  Office  [phone]    Objective:   Vitals:   11/19/19 0738 11/19/19 0832 11/19/19 0840 11/19/19 1116  BP: 109/70     Pulse: 81 95 91 98  Resp: 18     Temp: 98 F (36.7 C)     TempSrc: Oral     SpO2: 100% 97% 93% 91%  Weight:      Height:        Wt Readings from Last 3 Encounters:  11/17/19 130.1  kg  11/15/19 123.4 kg  07/29/14 123.4 kg     Intake/Output Summary (Last 24 hours) at 11/19/2019 1141 Last data filed at 11/18/2019 1400 Gross per 24 hour  Intake 300 ml  Output --  Net 300 ml     Physical Exam Gen Exam:Alert awake-not in any distress HEENT:atraumatic, normocephalic Chest: B/L clear to auscultation anteriorly CVS:S1S2 regular Abdomen:soft non tender, non distended Extremities:no edema Neurology: Non focal Skin: no rash   Data Review:    CBC Recent Labs  Lab 11/15/19 2031 11/17/19 0506 11/18/19 0158 11/19/19 0059  WBC 3.9* 8.8 12.7* 11.7*  HGB 11.3* 11.3* 10.2* 10.0*  HCT 35.9* 35.5* 32.3* 32.8*  PLT 204 200 250 216  MCV 81.4 82.0 81.6 84.3  MCH 25.6* 26.1 25.8* 25.7*  MCHC 31.5 31.8 31.6 30.5  RDW 14.8 15.1 15.3  15.5  LYMPHSABS  --  0.6* 0.7 0.8  MONOABS  --  0.2 0.5 0.6  EOSABS  --  0.0 0.0 0.0  BASOSABS  --  0.0 0.0 0.0    Chemistries  Recent Labs  Lab 11/15/19 2031 11/17/19 0506 11/18/19 0158 11/19/19 0059  NA 132* 134* 139 138  K 3.2* 3.4* 4.1 4.5  CL 97* 96* 103 103  CO2 24 24 26 22   GLUCOSE 112* 128* 130* 126*  BUN 6 8 11 18   CREATININE 1.06* 1.16* 0.92 0.93  CALCIUM 8.4* 8.5* 8.5* 8.5*  AST  --  117* 57* 71*  ALT  --  87* 65* 83*  ALKPHOS  --  81 70 75  BILITOT  --  0.9 0.7 0.7   ------------------------------------------------------------------------------------------------------------------ Recent Labs    11/17/19 0506  TRIG 107    No results found for: HGBA1C ------------------------------------------------------------------------------------------------------------------ No results for input(s): TSH, T4TOTAL, T3FREE, THYROIDAB in the last 72 hours.  Invalid input(s): FREET3 ------------------------------------------------------------------------------------------------------------------ Recent Labs    11/17/19 0520  FERRITIN 105    Coagulation profile No results for input(s): INR, PROTIME in the last 168 hours.  Recent Labs    11/18/19 0158 11/19/19 0059  DDIMER 0.31 0.37    Cardiac Enzymes No results for input(s): CKMB, TROPONINI, MYOGLOBIN in the last 168 hours.  Invalid input(s): CK ------------------------------------------------------------------------------------------------------------------ No results found for: BNP  Micro Results Recent Results (from the past 240 hour(s))  Blood Culture (routine x 2)     Status: None (Preliminary result)   Collection Time: 11/17/19  5:40 AM   Specimen: BLOOD  Result Value Ref Range Status   Specimen Description BLOOD RIGHT ANTECUBITAL  Final   Special Requests   Final    BOTTLES DRAWN AEROBIC AND ANAEROBIC Blood Culture results may not be optimal due to an excessive volume of blood received in culture  bottles   Culture   Final    NO GROWTH 2 DAYS Performed at [facility], [address] [place], [place] [id number]    Report Status PENDING  Incomplete  Blood Culture (routine x 2)     Status: Abnormal   Collection Time: 11/17/19  5:50 AM   Specimen: BLOOD LEFT FOREARM  Result Value Ref Range Status   Specimen Description BLOOD LEFT FOREARM  Final   Special Requests   Final    BOTTLES DRAWN AEROBIC AND ANAEROBIC Blood Culture results may not be optimal due to an inadequate volume of blood received in culture bottles   Culture  Setup Time   Final    GRAM POSITIVE COCCI IN BOTH AEROBIC AND ANAEROBIC BOTTLES CRITICAL RESULT CALLED TO,  READ BACK BY AND VERIFIED WITH: PHARMD G ABBOTT 11/18/19 AT 0010 SK    Culture (A)  Final    STAPHYLOCOCCUS EPIDERMIDIS THE SIGNIFICANCE OF ISOLATING THIS ORGANISM FROM A SINGLE SET OF BLOOD CULTURES WHEN MULTIPLE SETS ARE DRAWN IS UNCERTAIN. PLEASE NOTIFY THE MICROBIOLOGY DEPARTMENT WITHIN ONE WEEK IF SPECIATION AND SENSITIVITIES ARE REQUIRED. Performed at [facility], [address], [place], [place] [address]    Report Status 11/19/2019 FINAL  Final  Blood Culture ID Panel (Reflexed)     Status: Abnormal   Collection Time: 11/17/19  5:50 AM  Result Value Ref Range Status   Enterococcus faecalis NOT DETECTED NOT DETECTED Final   Enterococcus Faecium NOT DETECTED NOT DETECTED Final   Listeria monocytogenes NOT DETECTED NOT DETECTED Final   Staphylococcus species DETECTED (A) NOT DETECTED Final    Comment: CRITICAL RESULT CALLED TO, READ BACK BY AND VERIFIED WITH: PHARMD G ABBOTT 11/18/19 AT 0010 SK    Staphylococcus aureus (BCID) NOT DETECTED NOT DETECTED Final   Staphylococcus epidermidis DETECTED (A) NOT DETECTED Final    Comment: CRITICAL RESULT CALLED TO, READ BACK BY AND VERIFIED WITH: PHARMD G ABBOTT 11/18/19 AT 0010 SK    Staphylococcus lugdunensis NOT DETECTED NOT DETECTED Final   Streptococcus species NOT DETECTED NOT  DETECTED Final   Streptococcus agalactiae NOT DETECTED NOT DETECTED Final   Streptococcus pneumoniae NOT DETECTED NOT DETECTED Final   Streptococcus pyogenes NOT DETECTED NOT DETECTED Final   A.calcoaceticus-baumannii NOT DETECTED NOT DETECTED Final   Bacteroides fragilis NOT DETECTED NOT DETECTED Final   Enterobacterales NOT DETECTED NOT DETECTED Final   Enterobacter cloacae complex NOT DETECTED NOT DETECTED Final   Escherichia coli NOT DETECTED NOT DETECTED Final   Klebsiella aerogenes NOT DETECTED NOT DETECTED Final   Klebsiella oxytoca NOT DETECTED NOT DETECTED Final   Klebsiella pneumoniae NOT DETECTED NOT DETECTED Final   Proteus species NOT DETECTED NOT DETECTED Final   Salmonella species NOT DETECTED NOT DETECTED Final   Serratia marcescens NOT DETECTED NOT DETECTED Final   Haemophilus influenzae NOT DETECTED NOT DETECTED Final   Neisseria meningitidis NOT DETECTED NOT DETECTED Final   Pseudomonas aeruginosa NOT DETECTED NOT DETECTED Final   Stenotrophomonas maltophilia NOT DETECTED NOT DETECTED Final   Candida albicans NOT DETECTED NOT DETECTED Final   Candida auris NOT DETECTED NOT DETECTED Final   Candida glabrata NOT DETECTED NOT DETECTED Final   Candida krusei NOT DETECTED NOT DETECTED Final   Candida parapsilosis NOT DETECTED NOT DETECTED Final   Candida tropicalis NOT DETECTED NOT DETECTED Final   Cryptococcus neoformans/gattii NOT DETECTED NOT DETECTED Final   Methicillin resistance mecA/C NOT DETECTED NOT DETECTED Final    Comment: Performed at [facility], [address], [place], [place] [address]  MRSA PCR Screening     Status: None   Collection Time: 11/17/19  5:14 PM   Specimen: Nasal Mucosa; Nasopharyngeal  Result Value Ref Range Status   MRSA by PCR NEGATIVE NEGATIVE Final    Comment:        The GeneXpert MRSA Assay (FDA approved for NASAL specimens only), is one component of a comprehensive MRSA colonization surveillance program. It is  not intended to diagnose MRSA infection nor to guide or monitor treatment for MRSA infections. Performed at [facility], [address], [place], [place] [address]     Radiology Reports CT Angio Chest PE W and/or Wo Contrast  Result Date: 11/17/2019 CLINICAL DATA:  Worsening shortness of  breath, COVID positive EXAM: CT ANGIOGRAPHY CHEST WITH CONTRAST TECHNIQUE: Multidetector CT imaging of the chest was performed using the standard protocol during bolus administration of intravenous contrast. Multiplanar CT image reconstructions and MIPs were obtained to evaluate the vascular anatomy. CONTRAST:  45mL OMNIPAQUE IOHEXOL 350 MG/ML SOLN COMPARISON:  11/17/2019 FINDINGS: Cardiovascular: Limited with respiratory motion artifact. No significant central or proximal hilar pulmonary artery filling defect or emboli. Limited assessment of the lower lobe peripheral segmental branches because of low lung volumes and respiratory motion. Intact thoracic aorta. Pulsation artifact of the ascending thoracic aorta. No acute dissection or vascular finding. No mediastinal hemorrhage or hematoma. Normal heart size.  No pericardial effusion. Mediastinum/Nodes: No enlarged mediastinal, hilar, or axillary lymph nodes. Thyroid gland, trachea, and esophagus demonstrate no significant findings. Lungs/Pleura: Moderately severe bilateral mixed interstitial and airspace opacities throughout all lobes of both lungs. Dense bibasilar collapse/consolidation with central air bronchograms. Findings compatible with COVID related pneumonia. Overall low lung volumes. No pleural abnormality, effusion or pneumothorax. Upper Abdomen: No acute upper abdominal finding. Musculoskeletal: No acute osseous finding. Review of the MIP images confirms the above findings. IMPRESSION: No significant central or proximal hilar pulmonary embolus. Limited assessment of the smaller peripheral segmental branches because of low lung volumes and respiratory  motion artifact. Moderately severe multilobar pneumonia pattern as above compatible with COVID related pneumonia. Electronically Signed   By: Judie Petit.  Shick M.D.   On: 11/17/2019 08:32   DG Chest Port 1 View  Result Date: 11/17/2019 CLINICAL DATA:  Shortness of breath and COVID EXAM: PORTABLE CHEST 1 VIEW COMPARISON:  Nine days ago FINDINGS: Lower lung volumes with new bilateral hazy infiltrates. No effusion or pneumothorax. Normal heart size. IMPRESSION: Low volume chest with multifocal infiltrate. Electronically Signed   By: Marnee Spring M.D.   On: 11/17/2019 05:21

## 2019-11-20 LAB — COMPREHENSIVE METABOLIC PANEL
ALT: 103 U/L — ABNORMAL HIGH (ref 0–44)
AST: 61 U/L — ABNORMAL HIGH (ref 15–41)
Albumin: 2.4 g/dL — ABNORMAL LOW (ref 3.5–5.0)
Alkaline Phosphatase: 70 U/L (ref 38–126)
Anion gap: 9 (ref 5–15)
BUN: 20 mg/dL (ref 6–20)
CO2: 25 mmol/L (ref 22–32)
Calcium: 8.6 mg/dL — ABNORMAL LOW (ref 8.9–10.3)
Chloride: 103 mmol/L (ref 98–111)
Creatinine, Ser: 0.98 mg/dL (ref 0.44–1.00)
GFR, Estimated: >60 mL/min (ref >60)
Glucose, Bld: 133 mg/dL — ABNORMAL HIGH (ref 70–99)
Potassium: 4.3 mmol/L (ref 3.5–5.1)
Sodium: 137 mmol/L (ref 135–145)
Total Bilirubin: 0.8 mg/dL (ref 0.3–1.2)
Total Protein: 6 g/dL — ABNORMAL LOW (ref 6.5–8.1)

## 2019-11-20 LAB — CBC WITH DIFFERENTIAL/PLATELET
Abs Immature Granulocytes: 0.34 10*3/uL — ABNORMAL HIGH (ref 0.00–0.07)
Basophils Absolute: 0 10*3/uL (ref 0.0–0.1)
Basophils Relative: 0 %
Eosinophils Absolute: 0.1 10*3/uL (ref 0.0–0.5)
Eosinophils Relative: 0 %
HCT: 34.8 % — ABNORMAL LOW (ref 36.0–46.0)
Hemoglobin: 10.8 g/dL — ABNORMAL LOW (ref 12.0–15.0)
Immature Granulocytes: 2 %
Lymphocytes Relative: 7 %
Lymphs Abs: 0.9 10*3/uL (ref 0.7–4.0)
MCH: 25.7 pg — ABNORMAL LOW (ref 26.0–34.0)
MCHC: 31 g/dL (ref 30.0–36.0)
MCV: 82.7 fL (ref 80.0–100.0)
Monocytes Absolute: 0.6 10*3/uL (ref 0.1–1.0)
Monocytes Relative: 4 %
Neutro Abs: 12.1 10*3/uL — ABNORMAL HIGH (ref 1.7–7.7)
Neutrophils Relative %: 87 %
Platelets: 332 10*3/uL (ref 150–400)
RBC: 4.21 MIL/uL (ref 3.87–5.11)
RDW: 15.1 % (ref 11.5–15.5)
WBC: 14 10*3/uL — ABNORMAL HIGH (ref 4.0–10.5)
nRBC: 0 % (ref 0.0–0.2)

## 2019-11-20 LAB — D-DIMER, QUANTITATIVE: D-Dimer, Quant: 0.31 ug/mL-FEU (ref 0.00–0.50)

## 2019-11-20 LAB — GLUCOSE, CAPILLARY
Glucose-Capillary: 100 mg/dL — ABNORMAL HIGH (ref 70–99)
Glucose-Capillary: 125 mg/dL — ABNORMAL HIGH (ref 70–99)
Glucose-Capillary: 108 mg/dL — ABNORMAL HIGH (ref 70–99)
Glucose-Capillary: 156 mg/dL — ABNORMAL HIGH (ref 70–99)

## 2019-11-20 LAB — C-REACTIVE PROTEIN: CRP: 3.2 mg/dL — ABNORMAL HIGH (ref <1.0)

## 2019-11-20 NOTE — Progress Notes (Signed)
PROGRESS NOTE                                                                                                                                                                                                             Patient Demographics:    Elizabeth Christian, is a 34 y.o. female, DOB - 06/11/1985, ZOX:[id number]  Outpatient Primary MD for the patient is Mila Palmer, MD   Admit date - 11/17/2019   LOS - 3  Chief Complaint  Patient presents with  . Covid Positive  . Shortness of Breath       Brief Narrative: Patient is a 34 y.o. female with no PMHx of presented with severe hypoxemia due to breakthrough COVID-19 infection (fully vaccinated)  COVID-19 vaccinated status: Vaccinated  Significant Events: 10/25>> Admit to [facility] for severe hypoxemia due to COVID-19 pneumonia  Significant studies: 10/25>> CTA chest: No PE, multi lobar pneumonia  COVID-19 medications: Steroids: 10/23>> Remdesivir: 10/24>> Actemra>> 10/25 x 1  Antibiotics: None  Microbiology data: 10/25 blood culture>> 1/2 staph epidermidis  Procedures: None  Consults: None  DVT prophylaxis: Prophylactic Lovenox    Subjective:   No major issues overnight-had mild shortness of breath when she ambulated yesterday-required 6 L with ambulation-currently stable at 2 L at rest.   Assessment  & Plan :   Acute Hypoxic Resp Failure due to Covid 19 Viral pneumonia: Had severe hypoxemia when she first presented-but rapidly improved with treatment-she is stable on 3 L of oxygen at rest-but seems to require around 6 L with ambulation.  Continue steroids/Remdesivir-continue to attempt to slowly titrate down FiO2.  May need to evaluate for home O2 when she gets closer to discharge.    Fever: afebrile O2 requirements:  SpO2: 92 % O2 Flow Rate (L/min): 3 L/min   COVID-19 Labs: Recent Labs    11/18/19 0158 11/19/19 0059 11/20/19 0124  DDIMER  0.31 0.37 0.31  CRP 17.6* 7.3* 3.2*    No results found for: BNP  Recent Labs  Lab 11/17/19 0506  PROCALCITON <0.10    No results found for: SARSCOV2NAA   Prone/Incentive Spirometry: encouraged  incentive spirometry use 3-4/hour.  Transaminitis: Secondary to COVID-19-mild-continue monitoring closely.  Morbid Obesity: Estimated body mass index is 42.36 kg/m as calculated from the following:   Height as of this encounter:  (1.753 m).  Weight as of this encounter: 130.1 kg.    ABG: No results found for: PHART, PCO2ART, PO2ART, HCO3, TCO2, ACIDBASEDEF, O2SAT  Vent Settings: N/A  Condition - Stable  Family Communication  : Patient to update family herself.  Code Status :  Full Code  Diet :  Diet Order            Diet regular Room service appropriate? Yes; Fluid consistency: Thin  Diet effective now                  Disposition Plan  :   Status is: Inpatient  Remains inpatient appropriate because:Inpatient level of care appropriate due to severity of illness   Dispo: The patient is from: Home              Anticipated d/c is to: Home              Anticipated d/c date is: 2 days              Patient currently is not medically stable to d/c.   Barriers to discharge: Hypoxia requiring O2 supplementation/complete 5 days of IV Remdesivir  Antimicorbials  :    Anti-infectives (From admission, onward)   Start     Dose/Rate Route Frequency Ordered Stop   11/18/19 1000  remdesivir 100 mg in sodium chloride 0.9 % 100 mL IVPB        100 mg 200 mL/hr over 30 Minutes Intravenous Daily 11/17/19 0517 11/22/19 0959   11/18/19 1000  remdesivir 100 mg in sodium chloride 0.9 % 100 mL IVPB  Status:  Discontinued       "Followed by" Linked Group Details   100 mg 200 mL/hr over 30 Minutes Intravenous Daily 11/17/19 1157 11/17/19 1205   11/17/19 1157  remdesivir 200 mg in sodium chloride 0.9% 250 mL IVPB  Status:  Discontinued       "Followed by" Linked Group Details     200 mg 580 mL/hr over 30 Minutes Intravenous Once 11/17/19 1157 11/17/19 1211   11/17/19 0600  remdesivir 100 mg in sodium chloride 0.9 % 100 mL IVPB        100 mg 200 mL/hr over 30 Minutes Intravenous Every 30 min 11/17/19 0517 11/17/19 1213      Inpatient Medications  Scheduled Meds: . enoxaparin (LOVENOX) injection  65 mg Subcutaneous Q24H  . insulin aspart  0-9 Units Subcutaneous TID WC  . methylPREDNISolone (SOLU-MEDROL) injection  60 mg Intravenous Q8H  . pantoprazole  40 mg Oral Daily  . sodium chloride flush  3 mL Intravenous Q12H   Continuous Infusions: . sodium chloride    . remdesivir 100 mg in NS 100 mL Stopped (11/19/19 0859)   PRN Meds:.sodium chloride, polyvinyl alcohol, sodium chloride flush   Time Spent in minutes  25  See all Orders from today for further details   Jeoffrey Massed M.D on 11/20/2019 at 10:17 AM  To page go to [email or web address] - use universal password  [facility] -  Office  [phone]    Objective:   Vitals:   11/19/19 2008 11/20/19 0021 11/20/19 0455 11/20/19 0747  BP: 122/84 115/73 112/73   Pulse: 100 79 72   Resp: 18 16 17 20   Temp: 98.1 F (36.7 C) 98 F (36.7 C) 98.1 F (36.7 C) 99 F (37.2 C)  TempSrc: Oral Oral Oral Axillary  SpO2: 92% 90% 92%   Weight:      Height:  Wt Readings from Last 3 Encounters:  11/17/19 130.1 kg  11/15/19 123.4 kg  07/29/14 123.4 kg    No intake or output data in the 24 hours ending 11/20/19 1017   Physical Exam Gen Exam:Alert awake-not in any distress HEENT:atraumatic, normocephalic Chest: B/L clear to auscultation anteriorly CVS:S1S2 regular Abdomen:soft non tender, non distended Extremities:no edema Neurology: Non focal Skin: no rash   Data Review:    CBC Recent Labs  Lab 11/15/19 2031 11/17/19 0506 11/18/19 0158 11/19/19 0059 11/20/19 0124  WBC 3.9* 8.8 12.7* 11.7* 14.0*  HGB 11.3* 11.3* 10.2* 10.0* 10.8*  HCT 35.9* 35.5* 32.3* 32.8* 34.8*  PLT  204 200 250 216 332  MCV 81.4 82.0 81.6 84.3 82.7  MCH 25.6* 26.1 25.8* 25.7* 25.7*  MCHC 31.5 31.8 31.6 30.5 31.0  RDW 14.8 15.1 15.3 15.5 15.1  LYMPHSABS  --  0.6* 0.7 0.8 0.9  MONOABS  --  0.2 0.5 0.6 0.6  EOSABS  --  0.0 0.0 0.0 0.1  BASOSABS  --  0.0 0.0 0.0 0.0    Chemistries  Recent Labs  Lab 11/15/19 2031 11/17/19 0506 11/18/19 0158 11/19/19 0059 11/20/19 0124  NA 132* 134* 139 138 137  K 3.2* 3.4* 4.1 4.5 4.3  CL 97* 96* 103 103 103  CO2 24 24 26 22 25   GLUCOSE 112* 128* 130* 126* 133*  BUN 6 8 11 18 20   CREATININE 1.06* 1.16* 0.92 0.93 0.98  CALCIUM 8.4* 8.5* 8.5* 8.5* 8.6*  AST  --  117* 57* 71* 61*  ALT  --  87* 65* 83* 103*  ALKPHOS  --  81 70 75 70  BILITOT  --  0.9 0.7 0.7 0.8   ------------------------------------------------------------------------------------------------------------------ No results for input(s): CHOL, HDL, LDLCALC, TRIG, CHOLHDL, LDLDIRECT in the last 72 hours.  No results found for: HGBA1C ------------------------------------------------------------------------------------------------------------------ No results for input(s): TSH, T4TOTAL, T3FREE, THYROIDAB in the last 72 hours.  Invalid input(s): FREET3 ------------------------------------------------------------------------------------------------------------------ No results for input(s): VITAMINB12, FOLATE, FERRITIN, TIBC, IRON, RETICCTPCT in the last 72 hours.  Coagulation profile No results for input(s): INR, PROTIME in the last 168 hours.  Recent Labs    11/19/19 0059 11/20/19 0124  DDIMER 0.37 0.31    Cardiac Enzymes No results for input(s): CKMB, TROPONINI, MYOGLOBIN in the last 168 hours.  Invalid input(s): CK ------------------------------------------------------------------------------------------------------------------ No results found for: BNP  Micro Results Recent Results (from the past 240 hour(s))  Blood Culture (routine x 2)     Status: None  (Preliminary result)   Collection Time: 11/17/19  5:40 AM   Specimen: BLOOD  Result Value Ref Range Status   Specimen Description BLOOD RIGHT ANTECUBITAL  Final   Special Requests   Final    BOTTLES DRAWN AEROBIC AND ANAEROBIC Blood Culture results may not be optimal due to an excessive volume of blood received in culture bottles   Culture   Final    NO GROWTH 3 DAYS Performed at [facility], [address] [place], [address] [place]    Report Status PENDING  Incomplete  Blood Culture (routine x 2)     Status: Abnormal   Collection Time: 11/17/19  5:50 AM   Specimen: BLOOD LEFT FOREARM  Result Value Ref Range Status   Specimen Description BLOOD LEFT FOREARM  Final   Special Requests   Final    BOTTLES DRAWN AEROBIC AND ANAEROBIC Blood Culture results may not be optimal due to an inadequate volume of blood received in culture bottles  Culture  Setup Time   Final    GRAM POSITIVE COCCI IN BOTH AEROBIC AND ANAEROBIC BOTTLES CRITICAL RESULT CALLED TO, READ BACK BY AND VERIFIED WITH: PHARMD G ABBOTT 11/18/19 AT 0010 SK    Culture (A)  Final    STAPHYLOCOCCUS EPIDERMIDIS THE SIGNIFICANCE OF ISOLATING THIS ORGANISM FROM A SINGLE SET OF BLOOD CULTURES WHEN MULTIPLE SETS ARE DRAWN IS UNCERTAIN. PLEASE NOTIFY THE MICROBIOLOGY DEPARTMENT WITHIN ONE WEEK IF SPECIATION AND SENSITIVITIES ARE REQUIRED. Performed at [facility], [address] [place], [place] [address]    Report Status 11/19/2019 FINAL  Final  Blood Culture ID Panel (Reflexed)     Status: Abnormal   Collection Time: 11/17/19  5:50 AM  Result Value Ref Range Status   Enterococcus faecalis NOT DETECTED NOT DETECTED Final   Enterococcus Faecium NOT DETECTED NOT DETECTED Final   Listeria monocytogenes NOT DETECTED NOT DETECTED Final   Staphylococcus species DETECTED (A) NOT DETECTED Final    Comment: CRITICAL RESULT CALLED TO, READ BACK BY AND VERIFIED WITH: PHARMD G ABBOTT 11/18/19 AT 0010 SK    Staphylococcus  aureus (BCID) NOT DETECTED NOT DETECTED Final   Staphylococcus epidermidis DETECTED (A) NOT DETECTED Final    Comment: CRITICAL RESULT CALLED TO, READ BACK BY AND VERIFIED WITH: PHARMD G ABBOTT 11/18/19 AT 0010 SK    Staphylococcus lugdunensis NOT DETECTED NOT DETECTED Final   Streptococcus species NOT DETECTED NOT DETECTED Final   Streptococcus agalactiae NOT DETECTED NOT DETECTED Final   Streptococcus pneumoniae NOT DETECTED NOT DETECTED Final   Streptococcus pyogenes NOT DETECTED NOT DETECTED Final   A.calcoaceticus-baumannii NOT DETECTED NOT DETECTED Final   Bacteroides fragilis NOT DETECTED NOT DETECTED Final   Enterobacterales NOT DETECTED NOT DETECTED Final   Enterobacter cloacae complex NOT DETECTED NOT DETECTED Final   Escherichia coli NOT DETECTED NOT DETECTED Final   Klebsiella aerogenes NOT DETECTED NOT DETECTED Final   Klebsiella oxytoca NOT DETECTED NOT DETECTED Final   Klebsiella pneumoniae NOT DETECTED NOT DETECTED Final   Proteus species NOT DETECTED NOT DETECTED Final   Salmonella species NOT DETECTED NOT DETECTED Final   Serratia marcescens NOT DETECTED NOT DETECTED Final   Haemophilus influenzae NOT DETECTED NOT DETECTED Final   Neisseria meningitidis NOT DETECTED NOT DETECTED Final   Pseudomonas aeruginosa NOT DETECTED NOT DETECTED Final   Stenotrophomonas maltophilia NOT DETECTED NOT DETECTED Final   Candida albicans NOT DETECTED NOT DETECTED Final   Candida auris NOT DETECTED NOT DETECTED Final   Candida glabrata NOT DETECTED NOT DETECTED Final   Candida krusei NOT DETECTED NOT DETECTED Final   Candida parapsilosis NOT DETECTED NOT DETECTED Final   Candida tropicalis NOT DETECTED NOT DETECTED Final   Cryptococcus neoformans/gattii NOT DETECTED NOT DETECTED Final   Methicillin resistance mecA/C NOT DETECTED NOT DETECTED Final    Comment: Performed at [facility], [address] [place], [place] [address]  MRSA PCR Screening     Status: None    Collection Time: 11/17/19  5:14 PM   Specimen: Nasal Mucosa; Nasopharyngeal  Result Value Ref Range Status   MRSA by PCR NEGATIVE NEGATIVE Final    Comment:        The GeneXpert MRSA Assay (FDA approved for NASAL specimens only), is one component of a comprehensive MRSA colonization surveillance program. It is not intended to diagnose MRSA infection nor to guide or monitor treatment for MRSA infections. Performed at [facility], [address] [place], [place] [address]  Radiology Reports CT Angio Chest PE W and/or Wo Contrast  Result Date: 11/17/2019 CLINICAL DATA:  Worsening shortness of breath, COVID positive EXAM: CT ANGIOGRAPHY CHEST WITH CONTRAST TECHNIQUE: Multidetector CT imaging of the chest was performed using the standard protocol during bolus administration of intravenous contrast. Multiplanar CT image reconstructions and MIPs were obtained to evaluate the vascular anatomy. CONTRAST:  20mL OMNIPAQUE IOHEXOL 350 MG/ML SOLN COMPARISON:  11/17/2019 FINDINGS: Cardiovascular: Limited with respiratory motion artifact. No significant central or proximal hilar pulmonary artery filling defect or emboli. Limited assessment of the lower lobe peripheral segmental branches because of low lung volumes and respiratory motion. Intact thoracic aorta. Pulsation artifact of the ascending thoracic aorta. No acute dissection or vascular finding. No mediastinal hemorrhage or hematoma. Normal heart size.  No pericardial effusion. Mediastinum/Nodes: No enlarged mediastinal, hilar, or axillary lymph nodes. Thyroid gland, trachea, and esophagus demonstrate no significant findings. Lungs/Pleura: Moderately severe bilateral mixed interstitial and airspace opacities throughout all lobes of both lungs. Dense bibasilar collapse/consolidation with central air bronchograms. Findings compatible with COVID related pneumonia. Overall low lung volumes. No pleural abnormality, effusion or pneumothorax. Upper  Abdomen: No acute upper abdominal finding. Musculoskeletal: No acute osseous finding. Review of the MIP images confirms the above findings. IMPRESSION: No significant central or proximal hilar pulmonary embolus. Limited assessment of the smaller peripheral segmental branches because of low lung volumes and respiratory motion artifact. Moderately severe multilobar pneumonia pattern as above compatible with COVID related pneumonia. Electronically Signed   By: Judie Petit.  Shick M.D.   On: 11/17/2019 08:32   DG Chest Port 1 View  Result Date: 11/17/2019 CLINICAL DATA:  Shortness of breath and COVID EXAM: PORTABLE CHEST 1 VIEW COMPARISON:  Nine days ago FINDINGS: Lower lung volumes with new bilateral hazy infiltrates. No effusion or pneumothorax. Normal heart size. IMPRESSION: Low volume chest with multifocal infiltrate. Electronically Signed   By: Marnee Spring M.D.   On: 11/17/2019 05:21

## 2019-11-21 LAB — CBC WITH DIFFERENTIAL/PLATELET
Abs Immature Granulocytes: 0.3 10*3/uL — ABNORMAL HIGH (ref 0.00–0.07)
Basophils Absolute: 0 10*3/uL (ref 0.0–0.1)
Basophils Relative: 0 %
Eosinophils Absolute: 0 10*3/uL (ref 0.0–0.5)
Eosinophils Relative: 0 %
HCT: 38 % (ref 36.0–46.0)
Hemoglobin: 11.8 g/dL — ABNORMAL LOW (ref 12.0–15.0)
Lymphocytes Relative: 6 %
Lymphs Abs: 0.8 10*3/uL (ref 0.7–4.0)
MCH: 25.4 pg — ABNORMAL LOW (ref 26.0–34.0)
MCHC: 31.1 g/dL (ref 30.0–36.0)
MCV: 81.7 fL (ref 80.0–100.0)
Metamyelocytes Relative: 2 %
Monocytes Absolute: 0.5 10*3/uL (ref 0.1–1.0)
Monocytes Relative: 4 %
Neutro Abs: 11.4 10*3/uL — ABNORMAL HIGH (ref 1.7–7.7)
Neutrophils Relative %: 88 %
Platelets: 381 10*3/uL (ref 150–400)
RBC: 4.65 MIL/uL (ref 3.87–5.11)
RDW: 14.8 % (ref 11.5–15.5)
WBC: 13 10*3/uL — ABNORMAL HIGH (ref 4.0–10.5)
nRBC: 0 /100 WBC
nRBC: 0.2 % (ref 0.0–0.2)

## 2019-11-21 LAB — COMPREHENSIVE METABOLIC PANEL
ALT: 95 U/L — ABNORMAL HIGH (ref 0–44)
AST: 43 U/L — ABNORMAL HIGH (ref 15–41)
Albumin: 2.5 g/dL — ABNORMAL LOW (ref 3.5–5.0)
Alkaline Phosphatase: 63 U/L (ref 38–126)
Anion gap: 11 (ref 5–15)
BUN: 19 mg/dL (ref 6–20)
CO2: 23 mmol/L (ref 22–32)
Calcium: 8.7 mg/dL — ABNORMAL LOW (ref 8.9–10.3)
Chloride: 102 mmol/L (ref 98–111)
Creatinine, Ser: 0.9 mg/dL (ref 0.44–1.00)
GFR, Estimated: >60 mL/min (ref >60)
Glucose, Bld: 148 mg/dL — ABNORMAL HIGH (ref 70–99)
Potassium: 4.5 mmol/L (ref 3.5–5.1)
Sodium: 136 mmol/L (ref 135–145)
Total Bilirubin: 0.6 mg/dL (ref 0.3–1.2)
Total Protein: 6.2 g/dL — ABNORMAL LOW (ref 6.5–8.1)

## 2019-11-21 LAB — GLUCOSE, CAPILLARY
Glucose-Capillary: 103 mg/dL — ABNORMAL HIGH (ref 70–99)
Glucose-Capillary: 75 mg/dL (ref 70–99)

## 2019-11-21 LAB — D-DIMER, QUANTITATIVE: D-Dimer, Quant: 0.32 ug/mL-FEU (ref 0.00–0.50)

## 2019-11-21 LAB — C-REACTIVE PROTEIN: CRP: 1.4 mg/dL — ABNORMAL HIGH (ref <1.0)

## 2019-11-21 MED ORDER — BENZONATATE 100 MG PO CAPS: 100.0000 mg | ORAL_CAPSULE | Freq: Four times a day (QID) | ORAL | 0 refills | Status: AC | PRN

## 2019-11-21 MED ORDER — ALBUTEROL SULFATE HFA 108 (90 BASE) MCG/ACT IN AERS: 2.0000 | INHALATION_SPRAY | Freq: Four times a day (QID) | RESPIRATORY_TRACT | 0 refills | Status: AC | PRN

## 2019-11-21 MED ORDER — PREDNISONE 10 MG PO TABS: ORAL_TABLET | ORAL | 0 refills | Status: AC

## 2019-11-21 NOTE — Discharge Summary (Signed)
PATIENT DETAILS Name: Elizabeth Christian Age: 34 y.o. Sex: female Date of Birth: August 12, 1985 MRN: [id number]. Admitting Physician: Leatha Gilding, MD GQQ:[id number], Jasmine December, MD  Admit Date: 11/17/2019 Discharge date: 11/21/2019  Recommendations for Outpatient Follow-up:  1. Follow up with PCP in 1-2 weeks 2. Please obtain CMP/CBC in one week 3. Repeat Chest Xray in 4-6 week 4. Please assess at follow-up whether patient still requires home O2.  Admitted From:  Home  Disposition: Home   Home Health: No  Equipment/Devices: Oxygen 2-3L  Discharge Condition: Stable  CODE STATUS: FULL CODE  Diet recommendation:  Diet Order            Diet general           Diet regular Room service appropriate? Yes; Fluid consistency: Thin  Diet effective now                  Brief Narrative: Patient is a 34 y.o. female with no PMHx of presented with severe hypoxemia due to breakthrough COVID-19 infection (fully vaccinated)  COVID-19 vaccinated status: Vaccinated  Significant Events: 10/25>> Admit to [facility] for severe hypoxemia due to COVID-19 pneumonia  Significant studies: 10/25>> CTA chest: No PE, multi lobar pneumonia  COVID-19 medications: Steroids: 10/23>> Remdesivir: 10/24>>10/29 Actemra>> 10/25 x 1  Antibiotics: None  Microbiology data: 10/25 blood culture>> 1/2 staph epidermidis  Procedures: None  Consults: None  Brief Hospital Course: Acute Hypoxic Resp Failure due to Covid 19 Viral pneumonia: Had severe hypoxemia when she first presented-but rapidly improved with treatment-this morning she was titrated to room air-however she requires  O2 with ambulation.  She will be discharged on home O2-and will continue on tapering steroids for a few more days.  Patient was asked to follow-up with her primary care practitioner in a few weeks to reassess whether she still requires home O2.     COVID-19 Labs:  Recent Labs    11/19/19 0059 11/20/19 0124  11/21/19 0103  DDIMER 0.37 0.31 0.32  CRP 7.3* 3.2* 1.4*    No results found for: SARSCOV2NAA   Transaminitis: Secondary to COVID-19-mild-continue monitoring closely.  Morbid Obesity: Estimated body mass index is 42.36 kg/m as calculated from the following:   Height as of this encounter: 5\' 9"  (1.753 m).   Weight as of this encounter: 130.1 kg.      Discharge Diagnoses:  Active Problems:   Acute hypoxemic respiratory failure due to COVID-19 [facility])   Discharge Instructions:    Person Under Monitoring Name: Elizabeth Christian  Location: [address] [place]   Infection Prevention Recommendations for Individuals Confirmed to have, or Being Evaluated for, 2019 Novel Coronavirus (COVID-19) Infection Who Receive Care at Home  Individuals who are confirmed to have, or are being evaluated for, COVID-19 should follow the prevention steps below until a healthcare provider or local or state health department says they can return to normal activities.  Stay home except to get medical care You should restrict activities outside your home, except for getting medical care. Do not go to work, school, or public areas, and do not use public transportation or taxis.  Call ahead before visiting your doctor Before your medical appointment, call the healthcare provider and tell them that you have, or are being evaluated for, COVID-19 infection. This will help the healthcare provider's office take steps to keep other people from getting infected. Ask your healthcare provider to call the local or state health department.  Monitor your  symptoms Seek prompt medical attention if your illness is worsening (e.g., difficulty breathing). Before going to your medical appointment, call the healthcare provider and tell them that you have, or are being evaluated for, COVID-19 infection. Ask your healthcare provider to call the local or state health department.  Wear a  facemask You should wear a facemask that covers your nose and mouth when you are in the same room with other people and when you visit a healthcare provider. People who live with or visit you should also wear a facemask while they are in the same room with you.  Separate yourself from other people in your home As much as possible, you should stay in a different room from other people in your home. Also, you should use a separate bathroom, if available.  Avoid sharing household items You should not share dishes, drinking glasses, cups, eating utensils, towels, bedding, or other items with other people in your home. After using these items, you should wash them thoroughly with soap and water.  Cover your coughs and sneezes Cover your mouth and nose with a tissue when you cough or sneeze, or you can cough or sneeze into your sleeve. Throw used tissues in a lined trash can, and immediately wash your hands with soap and water for at least 20 seconds or use an alcohol-based hand rub.  Wash your [employer] your hands often and thoroughly with soap and water for at least 20 seconds. You can use an alcohol-based hand sanitizer if soap and water are not available and if your hands are not visibly dirty. Avoid touching your eyes, nose, and mouth with unwashed hands.   Prevention Steps for Caregivers and Household Members of Individuals Confirmed to have, or Being Evaluated for, COVID-19 Infection Being Cared for in the Home  If you live with, or provide care at home for, a person confirmed to have, or being evaluated for, COVID-19 infection please follow these guidelines to prevent infection:  Follow healthcare provider's instructions Make sure that you understand and can help the patient follow any healthcare provider instructions for all care.  Provide for the patient's basic needs You should help the patient with basic needs in the home and provide support for getting groceries,  prescriptions, and other personal needs.  Monitor the patient's symptoms If they are getting sicker, call his or her medical provider and tell them that the patient has, or is being evaluated for, COVID-19 infection. This will help the healthcare provider's office take steps to keep other people from getting infected. Ask the healthcare provider to call the local or state health department.  Limit the number of people who have contact with the patient  If possible, have only one caregiver for the patient.  Other household members should stay in another home or place of residence. If this is not possible, they should stay  in another room, or be separated from the patient as much as possible. Use a separate bathroom, if available.  Restrict visitors who do not have an essential need to be in the home.  Keep older adults, very young children, and other sick people away from the patient Keep older adults, very young children, and those who have compromised immune systems or chronic health conditions away from the patient. This includes people with chronic heart, lung, or kidney conditions, diabetes, and cancer.  Ensure good ventilation Make sure that shared spaces in the home have good air flow, such as from an air conditioner or an  opened window, weather permitting.  Wash your hands often  Wash your hands often and thoroughly with soap and water for at least 20 seconds. You can use an alcohol based hand sanitizer if soap and water are not available and if your hands are not visibly dirty.  Avoid touching your eyes, nose, and mouth with unwashed hands.  Use disposable paper towels to dry your hands. If not available, use dedicated cloth towels and replace them when they become wet.  Wear a facemask and gloves  Wear a disposable facemask at all times in the room and gloves when you touch or have contact with the patient's blood, body fluids, and/or secretions or excretions, such as  sweat, saliva, sputum, nasal mucus, vomit, urine, or feces.  Ensure the mask fits over your nose and mouth tightly, and do not touch it during use.  Throw out disposable facemasks and gloves after using them. Do not reuse.  Wash your hands immediately after removing your facemask and gloves.  If your personal clothing becomes contaminated, carefully remove clothing and launder. Wash your hands after handling contaminated clothing.  Place all used disposable facemasks, gloves, and other waste in a lined container before disposing them with other household waste.  Remove gloves and wash your hands immediately after handling these items.  Do not share dishes, glasses, or other household items with the patient  Avoid sharing household items. You should not share dishes, drinking glasses, cups, eating utensils, towels, bedding, or other items with a patient who is confirmed to have, or being evaluated for, COVID-19 infection.  After the person uses these items, you should wash them thoroughly with soap and water.  Wash laundry thoroughly  Immediately remove and wash clothes or bedding that have blood, body fluids, and/or secretions or excretions, such as sweat, saliva, sputum, nasal mucus, vomit, urine, or feces, on them.  Wear gloves when handling laundry from the patient.  Read and follow directions on labels of laundry or clothing items and detergent. In general, wash and dry with the warmest temperatures recommended on the label.  Clean all areas the individual has used often  Clean all touchable surfaces, such as counters, tabletops, doorknobs, bathroom fixtures, toilets, phones, keyboards, tablets, and bedside tables, every day. Also, clean any surfaces that may have blood, body fluids, and/or secretions or excretions on them.  Wear gloves when cleaning surfaces the patient has come in contact with.  Use a diluted bleach solution (e.g., dilute bleach with 1 part bleach and 10 parts  water) or a household disinfectant with a label that says EPA-registered for coronaviruses. To make a bleach solution at home, add 1 tablespoon of bleach to 1 quart (4 cups) of water. For a larger supply, add  cup of bleach to 1 gallon (16 cups) of water.  Read labels of cleaning products and follow recommendations provided on product labels. Labels contain instructions for safe and effective use of the cleaning product including precautions you should take when applying the product, such as wearing gloves or eye protection and making sure you have good ventilation during use of the product.  Remove gloves and wash hands immediately after cleaning.  Monitor yourself for signs and symptoms of illness Caregivers and household members are considered close contacts, should monitor their health, and will be asked to limit movement outside of the home to the extent possible. Follow the monitoring steps for close contacts listed on the symptom monitoring form.   ? If you have additional questions, contact  your local health department or call the epidemiologist on call at [phone] (available 24/7). ? This guidance is subject to change. For the most up-to-date guidance from CDC, please refer to their website: TripMet[employer].hu    Activity:  As tolerated   Discharge Instructions    Call MD for:  difficulty breathing, headache or visual disturbances   Complete by: As directed    Diet general   Complete by: As directed    Discharge instructions   Complete by: As directed    Follow with Primary MD  Mila Palmer, MD in 1-2 weeks  You have mildly elevated liver enzymes with COVID-19-please ask your primary care practitioner to repeat blood work in the next 1-2 weeks.  If you develop  worsening shortness of breath-please seek immediate medical attention.  Please ask your primary care practitioner to reassess at follow-up whether you  still require oxygen.  Please get a complete blood count and chemistry panel checked by your Primary MD at your next visit, and again as instructed by your Primary MD.  Get Medicines reviewed and adjusted: Please take all your medications with you for your next visit with your Primary MD  Laboratory/radiological data: Please request your Primary MD to go over all hospital tests and procedure/radiological results at the follow up, please ask your Primary MD to get all Hospital records sent to his/her office.  In some cases, they will be blood work, cultures and biopsy results pending at the time of your discharge. Please request that your primary care M.D. follows up on these results.  Also Note the following: If you experience worsening of your admission symptoms, develop shortness of breath, life threatening emergency, suicidal or homicidal thoughts you must seek medical attention immediately by calling 911 or calling your MD immediately  if symptoms less severe.  You must read complete instructions/literature along with all the possible adverse reactions/side effects for all the Medicines you take and that have been prescribed to you. Take any new Medicines after you have completely understood and accpet all the possible adverse reactions/side effects.   Do not drive when taking Pain medications or sleeping medications (Benzodaizepines)  Do not take more than prescribed Pain, Sleep and Anxiety Medications. It is not advisable to combine anxiety,sleep and pain medications without talking with your primary care practitioner  Special Instructions: If you have smoked or chewed Tobacco  in the last 2 yrs please stop smoking, stop any regular Alcohol  and or any Recreational drug use.  Wear Seat belts while driving.  Please note: You were cared for by a hospitalist during your hospital stay. Once you are discharged, your primary care physician will handle any further medical issues. Please note  that NO REFILLS for any discharge medications will be authorized once you are discharged, as it is imperative that you return to your primary care physician (or establish a relationship with a primary care physician if you do not have one) for your post hospital discharge needs so that they can reassess your need for medications and monitor your lab values.   1.)  21 days of isolation from 11/13/2019.   Increase activity slowly   Complete by: As directed      Allergies as of 11/21/2019   No Known Allergies     Medication List    TAKE these medications   albuterol 108 (90 Base) MCG/ACT inhaler Commonly known as: VENTOLIN HFA Inhale 2 puffs into the lungs every 6 (six) hours as needed for wheezing or  shortness of breath.   benzonatate 100 MG capsule Commonly known as: Tessalon Perles Take 1 capsule (100 mg total) by mouth every 6 (six) hours as needed for cough.   oxyCODONE-acetaminophen 5-325 MG tablet Commonly known as: PERCOCET/ROXICET Take 1-2 tablets by mouth every 6 (six) hours as needed for severe pain.   predniSONE 10 MG tablet Commonly known as: DELTASONE Take 40 mg daily for 1 day, 30 mg daily for 1 day, 20 mg daily for 1 days,10 mg daily for 1 day, then stop What changed:   medication strength  how much to take  how to take this  when to take this  additional instructions   topiramate 50 MG tablet Commonly known as: TOPAMAX Take 50 mg by mouth 2 (two) times daily.            Durable Medical Equipment  (From admission, onward)         Start     Ordered   11/21/19 0917  For home use only DME oxygen  Once       Question Answer Comment  Length of Need 6 Months   Mode or (Route) Nasal cannula   Liters per Minute 3   Oxygen conserving device Yes   Oxygen delivery system Gas      11/21/19 0916          Follow-up Information    Mila PalmerWolters, Sharon, MD. Schedule an appointment as soon as possible for a visit in 1 week(s).   Specialty: Family  Medicine Contact information: [address] [place] [address] [phone]              No Known Allergies     Other Procedures/Studies: CT Angio Chest PE W and/or Wo Contrast  Result Date: 11/17/2019 CLINICAL DATA:  Worsening shortness of breath, COVID positive EXAM: CT ANGIOGRAPHY CHEST WITH CONTRAST TECHNIQUE: Multidetector CT imaging of the chest was performed using the standard protocol during bolus administration of intravenous contrast. Multiplanar CT image reconstructions and MIPs were obtained to evaluate the vascular anatomy. CONTRAST:  75mL OMNIPAQUE IOHEXOL 350 MG/ML SOLN COMPARISON:  11/17/2019 FINDINGS: Cardiovascular: Limited with respiratory motion artifact. No significant central or proximal hilar pulmonary artery filling defect or emboli. Limited assessment of the lower lobe peripheral segmental branches because of low lung volumes and respiratory motion. Intact thoracic aorta. Pulsation artifact of the ascending thoracic aorta. No acute dissection or vascular finding. No mediastinal hemorrhage or hematoma. Normal heart size.  No pericardial effusion. Mediastinum/Nodes: No enlarged mediastinal, hilar, or axillary lymph nodes. Thyroid gland, trachea, and esophagus demonstrate no significant findings. Lungs/Pleura: Moderately severe bilateral mixed interstitial and airspace opacities throughout all lobes of both lungs. Dense bibasilar collapse/consolidation with central air bronchograms. Findings compatible with COVID related pneumonia. Overall low lung volumes. No pleural abnormality, effusion or pneumothorax. Upper Abdomen: No acute upper abdominal finding. Musculoskeletal: No acute osseous finding. Review of the MIP images confirms the above findings. IMPRESSION: No significant central or proximal hilar pulmonary embolus. Limited assessment of the smaller peripheral segmental branches because of low lung volumes and respiratory motion artifact.  Moderately severe multilobar pneumonia pattern as above compatible with COVID related pneumonia. Electronically Signed   By: Judie PetitM.  Shick M.D.   On: 11/17/2019 08:32   DG Chest Port 1 View  Result Date: 11/17/2019 CLINICAL DATA:  Shortness of breath and COVID EXAM: PORTABLE CHEST 1 VIEW COMPARISON:  Nine days ago FINDINGS: Lower lung volumes with new bilateral hazy infiltrates. No effusion or  pneumothorax. Normal heart size. IMPRESSION: Low volume chest with multifocal infiltrate. Electronically Signed   By: Marnee Spring M.D.   On: 11/17/2019 05:21     TODAY-DAY OF DISCHARGE:  Subjective:   Elizabeth Christian today has no headache,no chest abdominal pain,no new weakness tingling or numbness, feels much better wants to go home today.  Objective:   Blood pressure 118/63, pulse 77, temperature 98.6 F (37 C), temperature source Oral, resp. rate 20, height 5\' 9"  (1.753 m), weight 130.1 kg, SpO2 (!) 87 %.  Intake/Output Summary (Last 24 hours) at 11/21/2019 0922 Last data filed at 11/21/2019 0423 Gross per 24 hour  Intake 440 ml  Output --  Net 440 ml   Filed Weights   11/17/19 0505 11/17/19 1130  Weight: 127 kg 130.1 kg    Exam: Awake Alert, Oriented *3, No new F.N deficits, Normal affect [place].AT,PERRAL Supple Neck,No JVD, No cervical lymphadenopathy appriciated.  Symmetrical Chest wall movement, Good air movement bilaterally, CTAB RRR,No Gallops,Rubs or new Murmurs, No Parasternal Heave +ve B.Sounds, Abd Soft, Non tender, No organomegaly appriciated, No rebound -guarding or rigidity. No Cyanosis, Clubbing or edema, No new Rash or bruise   PERTINENT RADIOLOGIC STUDIES: CT Angio Chest PE W and/or Wo Contrast  Result Date: 11/17/2019 CLINICAL DATA:  Worsening shortness of breath, COVID positive EXAM: CT ANGIOGRAPHY CHEST WITH CONTRAST TECHNIQUE: Multidetector CT imaging of the chest was performed using the standard protocol during bolus administration of intravenous contrast.  Multiplanar CT image reconstructions and MIPs were obtained to evaluate the vascular anatomy. CONTRAST:  69mL OMNIPAQUE IOHEXOL 350 MG/ML SOLN COMPARISON:  11/17/2019 FINDINGS: Cardiovascular: Limited with respiratory motion artifact. No significant central or proximal hilar pulmonary artery filling defect or emboli. Limited assessment of the lower lobe peripheral segmental branches because of low lung volumes and respiratory motion. Intact thoracic aorta. Pulsation artifact of the ascending thoracic aorta. No acute dissection or vascular finding. No mediastinal hemorrhage or hematoma. Normal heart size.  No pericardial effusion. Mediastinum/Nodes: No enlarged mediastinal, hilar, or axillary lymph nodes. Thyroid gland, trachea, and esophagus demonstrate no significant findings. Lungs/Pleura: Moderately severe bilateral mixed interstitial and airspace opacities throughout all lobes of both lungs. Dense bibasilar collapse/consolidation with central air bronchograms. Findings compatible with COVID related pneumonia. Overall low lung volumes. No pleural abnormality, effusion or pneumothorax. Upper Abdomen: No acute upper abdominal finding. Musculoskeletal: No acute osseous finding. Review of the MIP images confirms the above findings. IMPRESSION: No significant central or proximal hilar pulmonary embolus. Limited assessment of the smaller peripheral segmental branches because of low lung volumes and respiratory motion artifact. Moderately severe multilobar pneumonia pattern as above compatible with COVID related pneumonia. Electronically Signed   By: 11/19/2019.  Shick M.D.   On: 11/17/2019 08:32   DG Chest Port 1 View  Result Date: 11/17/2019 CLINICAL DATA:  Shortness of breath and COVID EXAM: PORTABLE CHEST 1 VIEW COMPARISON:  Nine days ago FINDINGS: Lower lung volumes with new bilateral hazy infiltrates. No effusion or pneumothorax. Normal heart size. IMPRESSION: Low volume chest with multifocal infiltrate.  Electronically Signed   By: 11/19/2019 M.D.   On: 11/17/2019 05:21     PERTINENT LAB RESULTS: CBC: Recent Labs    11/20/19 0124 11/21/19 0103  WBC 14.0* 13.0*  HGB 10.8* 11.8*  HCT 34.8* 38.0  PLT 332 381   CMET CMP     Component Value Date/Time   NA 136 11/21/2019 0103   K 4.5 11/21/2019 0103   CL 102 11/21/2019 0103  CO2 23 11/21/2019 0103   GLUCOSE 148 (H) 11/21/2019 0103   BUN 19 11/21/2019 0103   CREATININE 0.90 11/21/2019 0103   CALCIUM 8.7 (L) 11/21/2019 0103   PROT 6.2 (L) 11/21/2019 0103   ALBUMIN 2.5 (L) 11/21/2019 0103   AST 43 (H) 11/21/2019 0103   ALT 95 (H) 11/21/2019 0103   ALKPHOS 63 11/21/2019 0103   BILITOT 0.6 11/21/2019 0103   GFRNONAA >60 11/21/2019 0103    GFR Estimated Creatinine Clearance: 125.2 mL/min (by C-G formula based on SCr of 0.9 mg/dL). No results for input(s): LIPASE, AMYLASE in the last 72 hours. No results for input(s): CKTOTAL, CKMB, CKMBINDEX, TROPONINI in the last 72 hours. Invalid input(s): POCBNP Recent Labs    11/20/19 0124 11/21/19 0103  DDIMER 0.31 0.32   No results for input(s): HGBA1C in the last 72 hours. No results for input(s): CHOL, HDL, LDLCALC, TRIG, CHOLHDL, LDLDIRECT in the last 72 hours. No results for input(s): TSH, T4TOTAL, T3FREE, THYROIDAB in the last 72 hours.  Invalid input(s): FREET3 No results for input(s): VITAMINB12, FOLATE, FERRITIN, TIBC, IRON, RETICCTPCT in the last 72 hours. Coags: No results for input(s): INR in the last 72 hours.  Invalid input(s): PT Microbiology: Recent Results (from the past 240 hour(s))  Blood Culture (routine x 2)     Status: None (Preliminary result)   Collection Time: 11/17/19  5:40 AM   Specimen: BLOOD  Result Value Ref Range Status   Specimen Description BLOOD RIGHT ANTECUBITAL  Final   Special Requests   Final    BOTTLES DRAWN AEROBIC AND ANAEROBIC Blood Culture results may not be optimal due to an excessive volume of blood received in culture  bottles   Culture   Final    NO GROWTH 3 DAYS Performed at [facility], [address], [place], [place] [address]    Report Status PENDING  Incomplete  Blood Culture (routine x 2)     Status: Abnormal   Collection Time: 11/17/19  5:50 AM   Specimen: BLOOD LEFT FOREARM  Result Value Ref Range Status   Specimen Description BLOOD LEFT FOREARM  Final   Special Requests   Final    BOTTLES DRAWN AEROBIC AND ANAEROBIC Blood Culture results may not be optimal due to an inadequate volume of blood received in culture bottles   Culture  Setup Time   Final    GRAM POSITIVE COCCI IN BOTH AEROBIC AND ANAEROBIC BOTTLES CRITICAL RESULT CALLED TO, READ BACK BY AND VERIFIED WITH: PHARMD G ABBOTT 11/18/19 AT 0010 SK    Culture (A)  Final    STAPHYLOCOCCUS EPIDERMIDIS THE SIGNIFICANCE OF ISOLATING THIS ORGANISM FROM A SINGLE SET OF BLOOD CULTURES WHEN MULTIPLE SETS ARE DRAWN IS UNCERTAIN. PLEASE NOTIFY THE MICROBIOLOGY DEPARTMENT WITHIN ONE WEEK IF SPECIATION AND SENSITIVITIES ARE REQUIRED. Performed at [facility], [address] [place], [place] [address]    Report Status 11/19/2019 FINAL  Final  Blood Culture ID Panel (Reflexed)     Status: Abnormal   Collection Time: 11/17/19  5:50 AM  Result Value Ref Range Status   Enterococcus faecalis NOT DETECTED NOT DETECTED Final   Enterococcus Faecium NOT DETECTED NOT DETECTED Final   Listeria monocytogenes NOT DETECTED NOT DETECTED Final   Staphylococcus species DETECTED (A) NOT DETECTED Final    Comment: CRITICAL RESULT CALLED TO, READ BACK BY AND VERIFIED WITH: PHARMD G ABBOTT 11/18/19 AT 0010 SK    Staphylococcus aureus (BCID) NOT DETECTED NOT DETECTED Final  Staphylococcus epidermidis DETECTED (A) NOT DETECTED Final    Comment: CRITICAL RESULT CALLED TO, READ BACK BY AND VERIFIED WITH: PHARMD G ABBOTT 11/18/19 AT 0010 SK    Staphylococcus lugdunensis NOT DETECTED NOT DETECTED Final   Streptococcus species NOT DETECTED NOT  DETECTED Final   Streptococcus agalactiae NOT DETECTED NOT DETECTED Final   Streptococcus pneumoniae NOT DETECTED NOT DETECTED Final   Streptococcus pyogenes NOT DETECTED NOT DETECTED Final   A.calcoaceticus-baumannii NOT DETECTED NOT DETECTED Final   Bacteroides fragilis NOT DETECTED NOT DETECTED Final   Enterobacterales NOT DETECTED NOT DETECTED Final   Enterobacter cloacae complex NOT DETECTED NOT DETECTED Final   Escherichia coli NOT DETECTED NOT DETECTED Final   Klebsiella aerogenes NOT DETECTED NOT DETECTED Final   Klebsiella oxytoca NOT DETECTED NOT DETECTED Final   Klebsiella pneumoniae NOT DETECTED NOT DETECTED Final   Proteus species NOT DETECTED NOT DETECTED Final   Salmonella species NOT DETECTED NOT DETECTED Final   Serratia marcescens NOT DETECTED NOT DETECTED Final   Haemophilus influenzae NOT DETECTED NOT DETECTED Final   Neisseria meningitidis NOT DETECTED NOT DETECTED Final   Pseudomonas aeruginosa NOT DETECTED NOT DETECTED Final   Stenotrophomonas maltophilia NOT DETECTED NOT DETECTED Final   Candida albicans NOT DETECTED NOT DETECTED Final   Candida auris NOT DETECTED NOT DETECTED Final   Candida glabrata NOT DETECTED NOT DETECTED Final   Candida krusei NOT DETECTED NOT DETECTED Final   Candida parapsilosis NOT DETECTED NOT DETECTED Final   Candida tropicalis NOT DETECTED NOT DETECTED Final   Cryptococcus neoformans/gattii NOT DETECTED NOT DETECTED Final   Methicillin resistance mecA/C NOT DETECTED NOT DETECTED Final    Comment: Performed at [facility], [address], [place], [place] [address]  MRSA PCR Screening     Status: None   Collection Time: 11/17/19  5:14 PM   Specimen: Nasal Mucosa; Nasopharyngeal  Result Value Ref Range Status   MRSA by PCR NEGATIVE NEGATIVE Final    Comment:        The GeneXpert MRSA Assay (FDA approved for NASAL specimens only), is one component of a comprehensive MRSA colonization surveillance program. It is  not intended to diagnose MRSA infection nor to guide or monitor treatment for MRSA infections. Performed at [facility], [address], [place], [place] [address]     FURTHER DISCHARGE INSTRUCTIONS:  Get Medicines reviewed and adjusted: Please take all your medications with you for your next visit with your Primary MD  Laboratory/radiological data: Please request your Primary MD to go over all hospital tests and procedure/radiological results at the follow up, please ask your Primary MD to get all Hospital records sent to his/her office.  In some cases, they will be blood work, cultures and biopsy results pending at the time of your discharge. Please request that your primary care M.D. goes through all the records of your hospital data and follows up on these results.  Also Note the following: If you experience worsening of your admission symptoms, develop shortness of breath, life threatening emergency, suicidal or homicidal thoughts you must seek medical attention immediately by calling 911 or calling your MD immediately  if symptoms less severe.  You must read complete instructions/literature along with all the possible adverse reactions/side effects for all the Medicines you take and that have been prescribed to you. Take any new Medicines after you have completely understood and accpet all the possible adverse reactions/side effects.   Do not drive when taking Pain medications  or sleeping medications (Benzodaizepines)  Do not take more than prescribed Pain, Sleep and Anxiety Medications. It is not advisable to combine anxiety,sleep and pain medications without talking with your primary care practitioner  Special Instructions: If you have smoked or chewed Tobacco  in the last 2 yrs please stop smoking, stop any regular Alcohol  and or any Recreational drug use.  Wear Seat belts while driving.  Please note: You were cared for by a hospitalist during your hospital stay.  Once you are discharged, your primary care physician will handle any further medical issues. Please note that NO REFILLS for any discharge medications will be authorized once you are discharged, as it is imperative that you return to your primary care physician (or establish a relationship with a primary care physician if you do not have one) for your post hospital discharge needs so that they can reassess your need for medications and monitor your lab values.  Total Time spent coordinating discharge including counseling, education and face to face time equals 35 minutes.  Signed: Jazzlyn Christian 11/21/2019 9:22 AM

## 2019-11-21 NOTE — TOC Transition Note (Signed)
Transition of Care [facility]) - CM/SW Discharge Note   Patient Details  Name: Elizabeth Christian MRN: [id number] Date of Birth: 1985/05/08  Transition of Care [facility]) CM/SW Contact:  Kermit Balo, RN Phone Number: 11/21/2019, 10:16 AM   Clinical Narrative:    Pt is discharging home with oxygen. CM has spoken to patient and she has no preference on DME for the oxygen. Zack with [employer] will deliver oxygen for transport home to the room and concentrator to the patients home.  Pt has transport home when ready.   Final next level of care: Home/Self Care Barriers to Discharge: No Barriers Identified   Patient Goals and CMS Choice     Choice offered to / list presented to : Patient  Discharge Placement                       Discharge Plan and Services                DME Arranged: Oxygen DME Agency: [employer] Date DME Agency Contacted: 11/21/19   Representative spoke with at DME Agency: Zack            Social Determinants of Health (SDOH) Interventions     Readmission Risk Interventions No flowsheet data found.

## 2019-11-21 NOTE — Progress Notes (Signed)
SATURATION QUALIFICATIONS: (This note is used to comply with regulatory documentation for home oxygen)  Patient Saturations on Room Air at Rest = 93%  Patient Saturations on Room Air while Ambulating = 82%  Patient Saturations on 2 Liters of oxygen while Ambulating = 89%  Please briefly explain why patient needs home oxygen:Patient desats while ambulating without oxygen

## 2019-11-21 NOTE — Progress Notes (Signed)
Patient discharging home. Vital signs stable at time of discharge as reflected in discharge summary. Discharge education given and verbal understanding returned. Patient discharging with O2 condenser and O2 tank. No questions at this time. Patient to follow up with PCP within 2 weeks.

## 2019-11-21 NOTE — Discharge Instructions (Signed)
Person Under Monitoring Name: Elizabeth Christian  Location: [address] [place] [address]   Infection Prevention Recommendations for Individuals Confirmed to have, or Being Evaluated for, 2019 Novel Coronavirus (COVID-19) Infection Who Receive Care at Home  Individuals who are confirmed to have, or are being evaluated for, COVID-19 should follow the prevention steps below until a healthcare provider or local or state health department says they can return to normal activities.  Stay home except to get medical care You should restrict activities outside your home, except for getting medical care. Do not go to work, school, or public areas, and do not use public transportation or taxis.  Call ahead before visiting your doctor Before your medical appointment, call the healthcare provider and tell them that you have, or are being evaluated for, COVID-19 infection. This will help the healthcare providers office take steps to keep other people from getting infected. Ask your healthcare provider to call the local or state health department.  Monitor your symptoms Seek prompt medical attention if your illness is worsening (e.g., difficulty breathing). Before going to your medical appointment, call the healthcare provider and tell them that you have, or are being evaluated for, COVID-19 infection. Ask your healthcare provider to call the local or state health department.  Wear a facemask You should wear a facemask that covers your nose and mouth when you are in the same room with other people and when you visit a healthcare provider. People who live with or visit you should also wear a facemask while they are in the same room with you.  Separate yourself from other people in your home As much as possible, you should stay in a different room from other people in your home. Also, you should use a separate bathroom, if available.  Avoid sharing household items You  should not share dishes, drinking glasses, cups, eating utensils, towels, bedding, or other items with other people in your home. After using these items, you should wash them thoroughly with soap and water.  Cover your coughs and sneezes Cover your mouth and nose with a tissue when you cough or sneeze, or you can cough or sneeze into your sleeve. Throw used tissues in a lined trash can, and immediately wash your hands with soap and water for at least 20 seconds or use an alcohol-based hand rub.  Wash your [employer] your hands often and thoroughly with soap and water for at least 20 seconds. You can use an alcohol-based hand sanitizer if soap and water are not available and if your hands are not visibly dirty. Avoid touching your eyes, nose, and mouth with unwashed hands.   Prevention Steps for Caregivers and Household Members of Individuals Confirmed to have, or Being Evaluated for, COVID-19 Infection Being Cared for in the Home  If you live with, or provide care at home for, a person confirmed to have, or being evaluated for, COVID-19 infection please follow these guidelines to prevent infection:  Follow healthcare providers instructions Make sure that you understand and can help the patient follow any healthcare provider instructions for all care.  Provide for the patients basic needs You should help the patient with basic needs in the home and provide support for getting groceries, prescriptions, and other personal needs.  Monitor the patients symptoms If they are getting sicker, call his or her medical provider and tell them that the patient has, or is being evaluated for, COVID-19 infection. This will help the  healthcare providers office take steps to keep other people from getting infected. Ask the healthcare provider to call the local or state health department.  Limit the number of people who have contact with the patient  If possible, have only one caregiver for the  patient.  Other household members should stay in another home or place of residence. If this is not possible, they should stay  in another room, or be separated from the patient as much as possible. Use a separate bathroom, if available.  Restrict visitors who do not have an essential need to be in the home.  Keep older adults, very young children, and other sick people away from the patient Keep older adults, very young children, and those who have compromised immune systems or chronic health conditions away from the patient. This includes people with chronic heart, lung, or kidney conditions, diabetes, and cancer.  Ensure good ventilation Make sure that shared spaces in the home have good air flow, such as from an air conditioner or an opened window, weather permitting.  Wash your hands often  Wash your hands often and thoroughly with soap and water for at least 20 seconds. You can use an alcohol based hand sanitizer if soap and water are not available and if your hands are not visibly dirty.  Avoid touching your eyes, nose, and mouth with unwashed hands.  Use disposable paper towels to dry your hands. If not available, use dedicated cloth towels and replace them when they become wet.  Wear a facemask and gloves  Wear a disposable facemask at all times in the room and gloves when you touch or have contact with the patients blood, body fluids, and/or secretions or excretions, such as sweat, saliva, sputum, nasal mucus, vomit, urine, or feces.  Ensure the mask fits over your nose and mouth tightly, and do not touch it during use.  Throw out disposable facemasks and gloves after using them. Do not reuse.  Wash your hands immediately after removing your facemask and gloves.  If your personal clothing becomes contaminated, carefully remove clothing and launder. Wash your hands after handling contaminated clothing.  Place all used disposable facemasks, gloves, and other waste in a lined  container before disposing them with other household waste.  Remove gloves and wash your hands immediately after handling these items.  Do not share dishes, glasses, or other household items with the patient  Avoid sharing household items. You should not share dishes, drinking glasses, cups, eating utensils, towels, bedding, or other items with a patient who is confirmed to have, or being evaluated for, COVID-19 infection.  After the person uses these items, you should wash them thoroughly with soap and water.  Wash laundry thoroughly  Immediately remove and wash clothes or bedding that have blood, body fluids, and/or secretions or excretions, such as sweat, saliva, sputum, nasal mucus, vomit, urine, or feces, on them.  Wear gloves when handling laundry from the patient.  Read and follow directions on labels of laundry or clothing items and detergent. In general, wash and dry with the warmest temperatures recommended on the label.  Clean all areas the individual has used often  Clean all touchable surfaces, such as counters, tabletops, doorknobs, bathroom fixtures, toilets, phones, keyboards, tablets, and bedside tables, every day. Also, clean any surfaces that may have blood, body fluids, and/or secretions or excretions on them.  Wear gloves when cleaning surfaces the patient has come in contact with.  Use a diluted bleach solution (e.g., dilute bleach  with 1 part bleach and 10 parts water) or a household disinfectant with a label that says EPA-registered for coronaviruses. To make a bleach solution at home, add 1 tablespoon of bleach to 1 quart (4 cups) of water. For a larger supply, add  cup of bleach to 1 gallon (16 cups) of water.  Read labels of cleaning products and follow recommendations provided on product labels. Labels contain instructions for safe and effective use of the cleaning product including precautions you should take when applying the product, such as wearing gloves or  eye protection and making sure you have good ventilation during use of the product.  Remove gloves and wash hands immediately after cleaning.  Monitor yourself for signs and symptoms of illness Caregivers and household members are considered close contacts, should monitor their health, and will be asked to limit movement outside of the home to the extent possible. Follow the monitoring steps for close contacts listed on the symptom monitoring form.   ? If you have additional questions, contact your local health department or call the epidemiologist on call at [phone] (available 24/7). ? This guidance is subject to change. For the most up-to-date guidance from [facility], please refer to their website: [email or web address]

## 2019-11-22 LAB — CULTURE, BLOOD (ROUTINE X 2): Culture: NO GROWTH

## 2019-12-23 ENCOUNTER — Other Ambulatory Visit: Payer: Self-pay | Admitting: Family Medicine

## 2019-12-23 ENCOUNTER — Ambulatory Visit
Admission: RE | Admit: 2019-12-23 | Discharge: 2019-12-23 | Disposition: A | Discharge status: Home / Self Care | Payer: 59 | Source: Ambulatory Visit | Attending: Family Medicine | Admitting: Family Medicine

## 2019-12-23 DIAGNOSIS — J181 Lobar pneumonia, unspecified organism: Secondary | ICD-10-CM

## 2020-04-26 ENCOUNTER — Other Ambulatory Visit (HOSPITAL_COMMUNITY)
Admission: RE | Admit: 2020-04-26 | Discharge: 2020-04-26 | Disposition: A | Discharge status: Home / Self Care | Payer: 59 | Source: Ambulatory Visit | Attending: Family Medicine | Admitting: Family Medicine

## 2020-04-26 ENCOUNTER — Other Ambulatory Visit: Payer: Self-pay | Admitting: Family Medicine

## 2020-04-26 DIAGNOSIS — Z01411 Encounter for gynecological examination (general) (routine) with abnormal findings: Secondary | ICD-10-CM | POA: Diagnosis present

## 2020-04-28 LAB — CYTOLOGY - PAP
Comment: NEGATIVE
Diagnosis: NEGATIVE
High risk HPV: NEGATIVE

## 2020-04-30 ENCOUNTER — Ambulatory Visit: Payer: 59

## 2022-05-18 IMAGING — CT CT ANGIO CHEST
2 of 6 series · 18 of 46 positions shown · IV contrast (APPLIED)
Comparison: 11/17/2019

CLINICAL DATA: Worsening shortness of breath, COVID positive

EXAM:
CT ANGIOGRAPHY CHEST WITH CONTRAST
TECHNIQUE: Multidetector CT imaging of the chest was performed using the
standard protocol during bolus administration of intravenous
contrast. Multiplanar CT image reconstructions and MIPs were
obtained to evaluate the vascular anatomy.
CONTRAST:  75mL OMNIPAQUE IOHEXOL 350 MG/ML SOLN

[Series 6: thins · axial · 0.73mm/px · z∈[+1344,+1592]mm · 15 of 272 slices shown]
[im 12/272  lung]
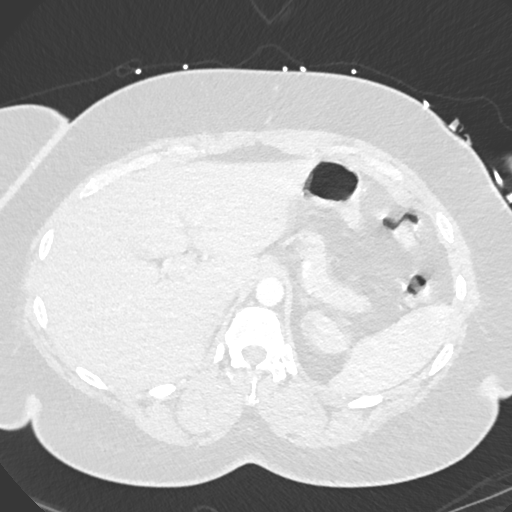
[im 36/272  soft-tissue]
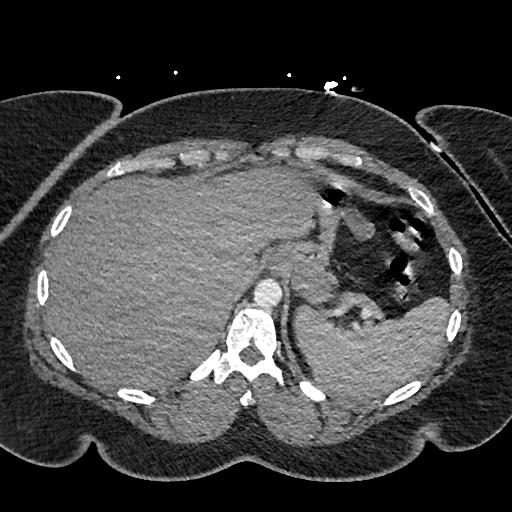
[im 48/272  lung]
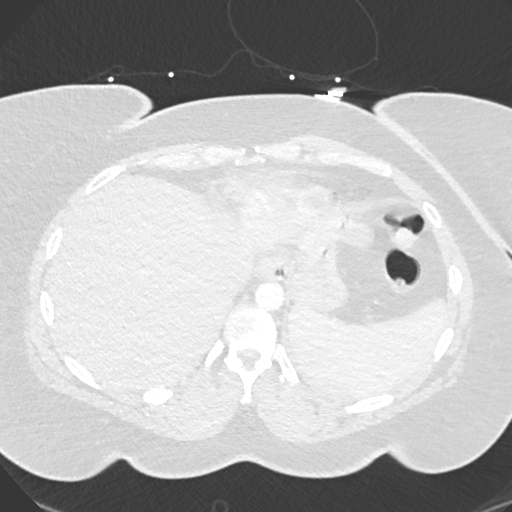
[im 71/272  soft-tissue]
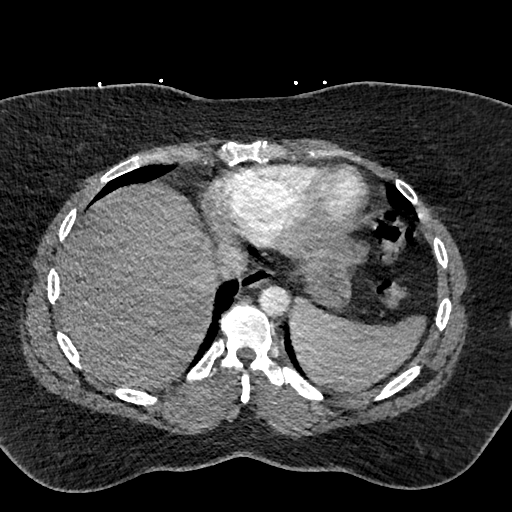
[im 83/272  lung]
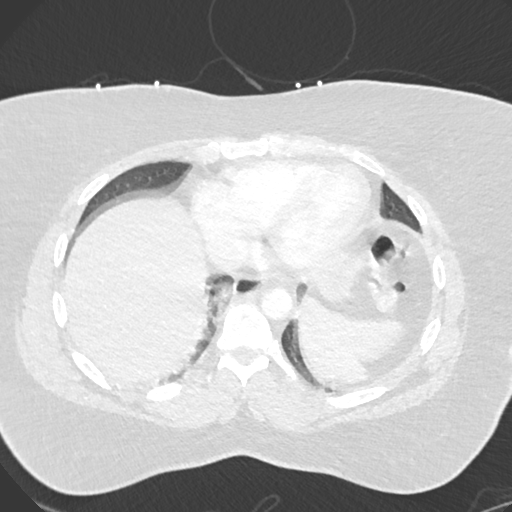
[im 107/272  soft-tissue]
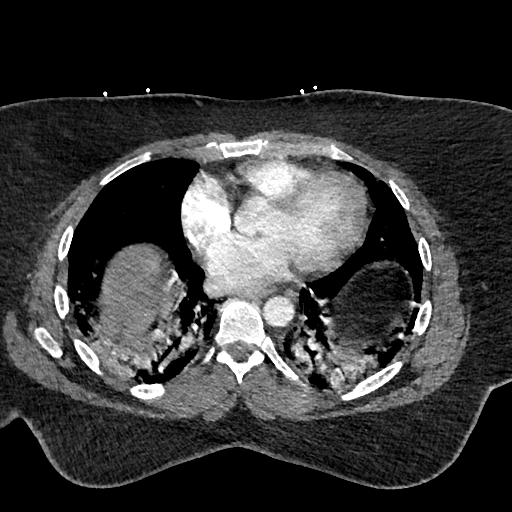
[im 118/272  lung]
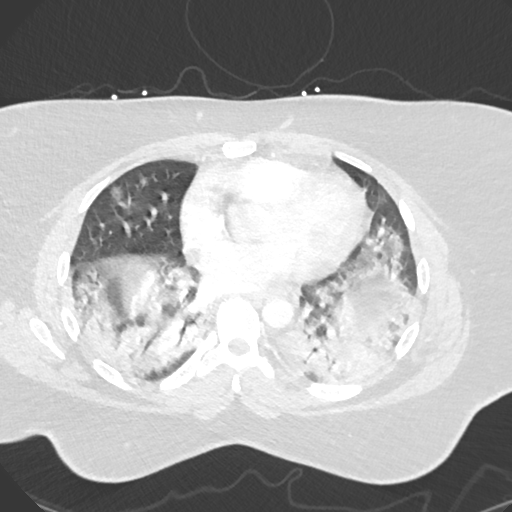
[im 142/272  soft-tissue]
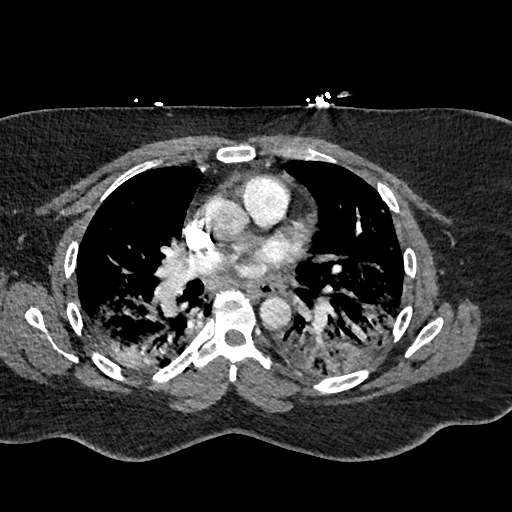
[im 154/272  lung]
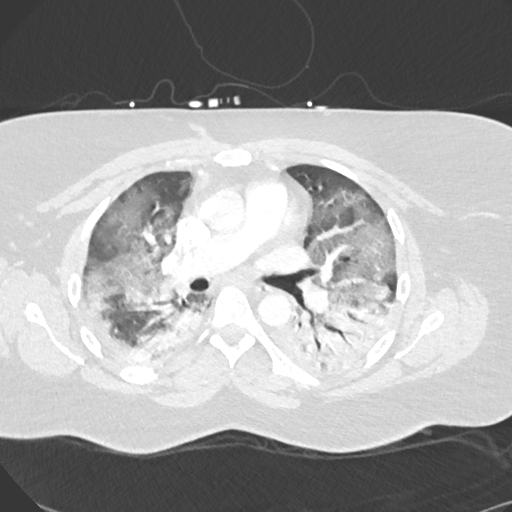
[im 165/272  soft-tissue]
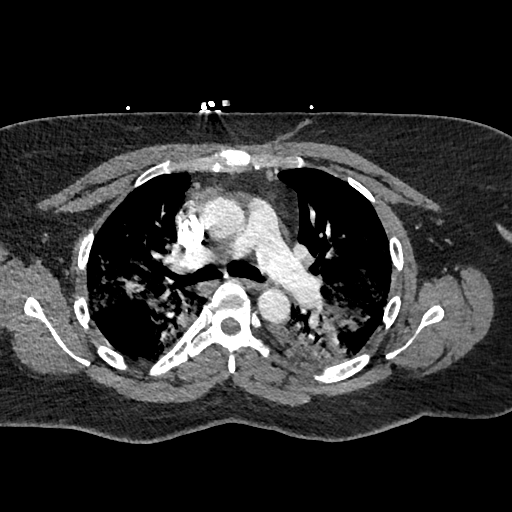
[im 189/272  lung]
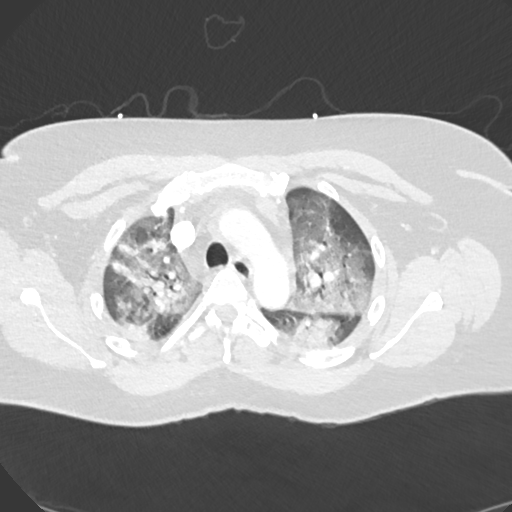
[im 201/272  soft-tissue]
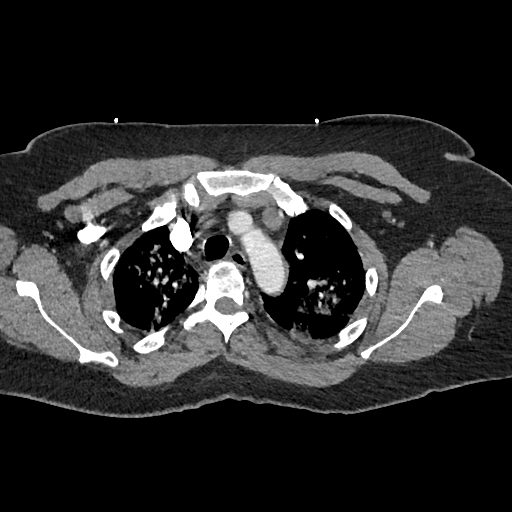
[im 224/272  lung]
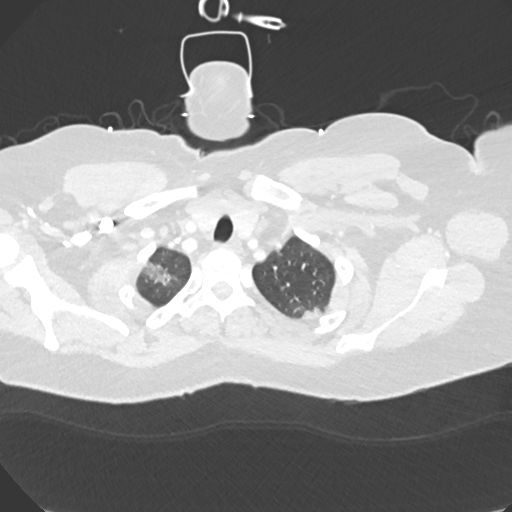
[im 236/272  soft-tissue]
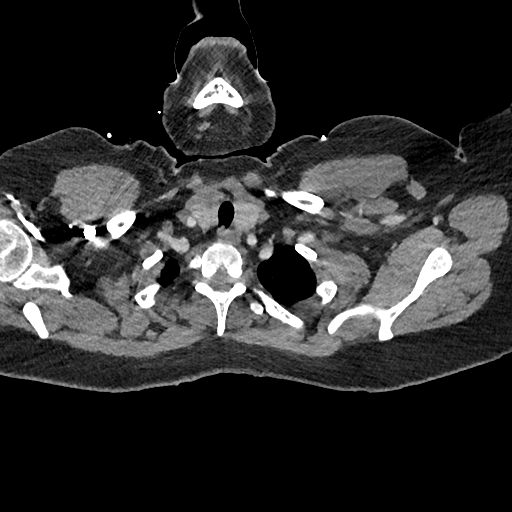
[im 260/272  lung]
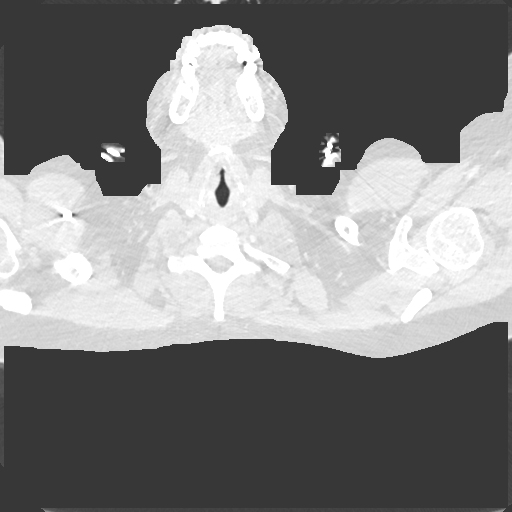

[Series 8: coronal mpr · coronal · 0.57mm/px · 3 of 106 slices shown]
[im 27/106  soft-tissue]
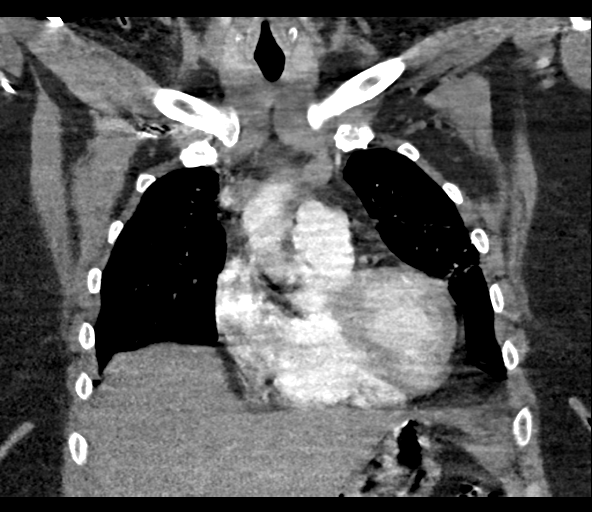
[im 53/106  soft-tissue]
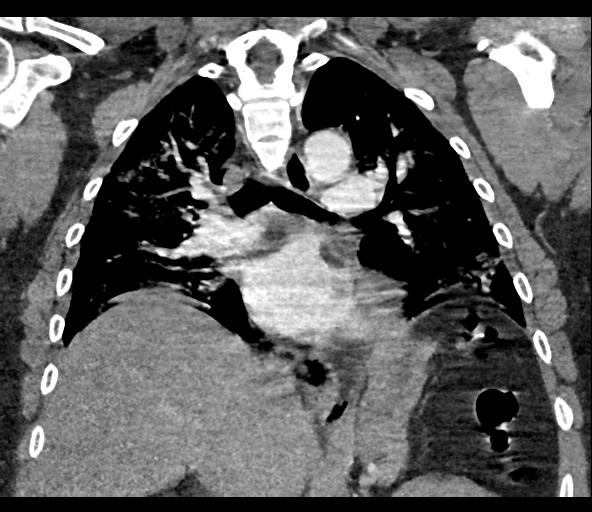
[im 79/106  soft-tissue]
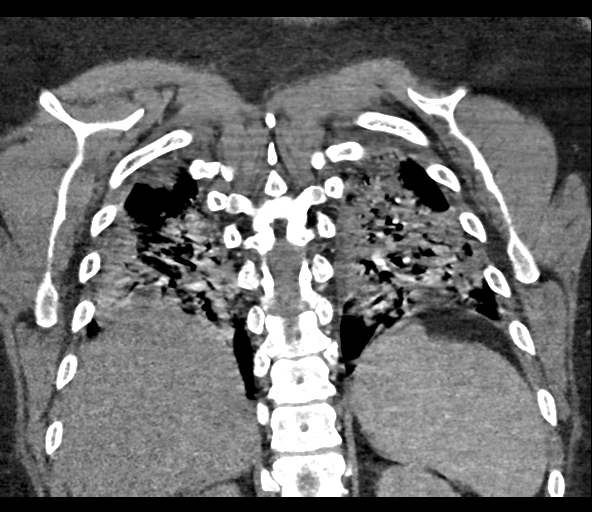

[18 of 46 positions shown; findings below may reference images not displayed]

FINDINGS: Cardiovascular: Limited with respiratory motion artifact. No
significant central or proximal hilar pulmonary artery filling
defect or emboli. Limited assessment of the lower lobe peripheral
segmental branches because of low lung volumes and respiratory
motion.

Intact thoracic aorta. Pulsation artifact of the ascending thoracic
aorta. No acute dissection or vascular finding. No mediastinal
hemorrhage or hematoma.

Normal heart size.  No pericardial effusion.

Mediastinum/Nodes: No enlarged mediastinal, hilar, or axillary lymph
nodes. Thyroid gland, trachea, and esophagus demonstrate no
significant findings.

Lungs/Pleura: Moderately severe bilateral mixed interstitial and
airspace opacities throughout all lobes of both lungs. Dense
bibasilar collapse/consolidation with central air bronchograms.
Findings compatible with COVID related pneumonia. Overall low lung
volumes.

No pleural abnormality, effusion or pneumothorax.

Upper Abdomen: No acute upper abdominal finding.

Musculoskeletal: No acute osseous finding.

Review of the MIP images confirms the above findings.
IMPRESSION: No significant central or proximal hilar pulmonary embolus. Limited
assessment of the smaller peripheral segmental branches because of
low lung volumes and respiratory motion artifact.

Moderately severe multilobar pneumonia pattern as above compatible
with COVID related pneumonia.

## 2022-06-23 IMAGING — CR DG CHEST 2V
2 series · 2 of 2 positions shown · non-contrast
Comparison: CT 11/17/2019, radiograph 11/17/2019

CLINICAL DATA: History of COVID pneumonia, continued congestion and
shortness of breath with fatigue

EXAM:
CHEST - 2 VIEW

[w chest pa]
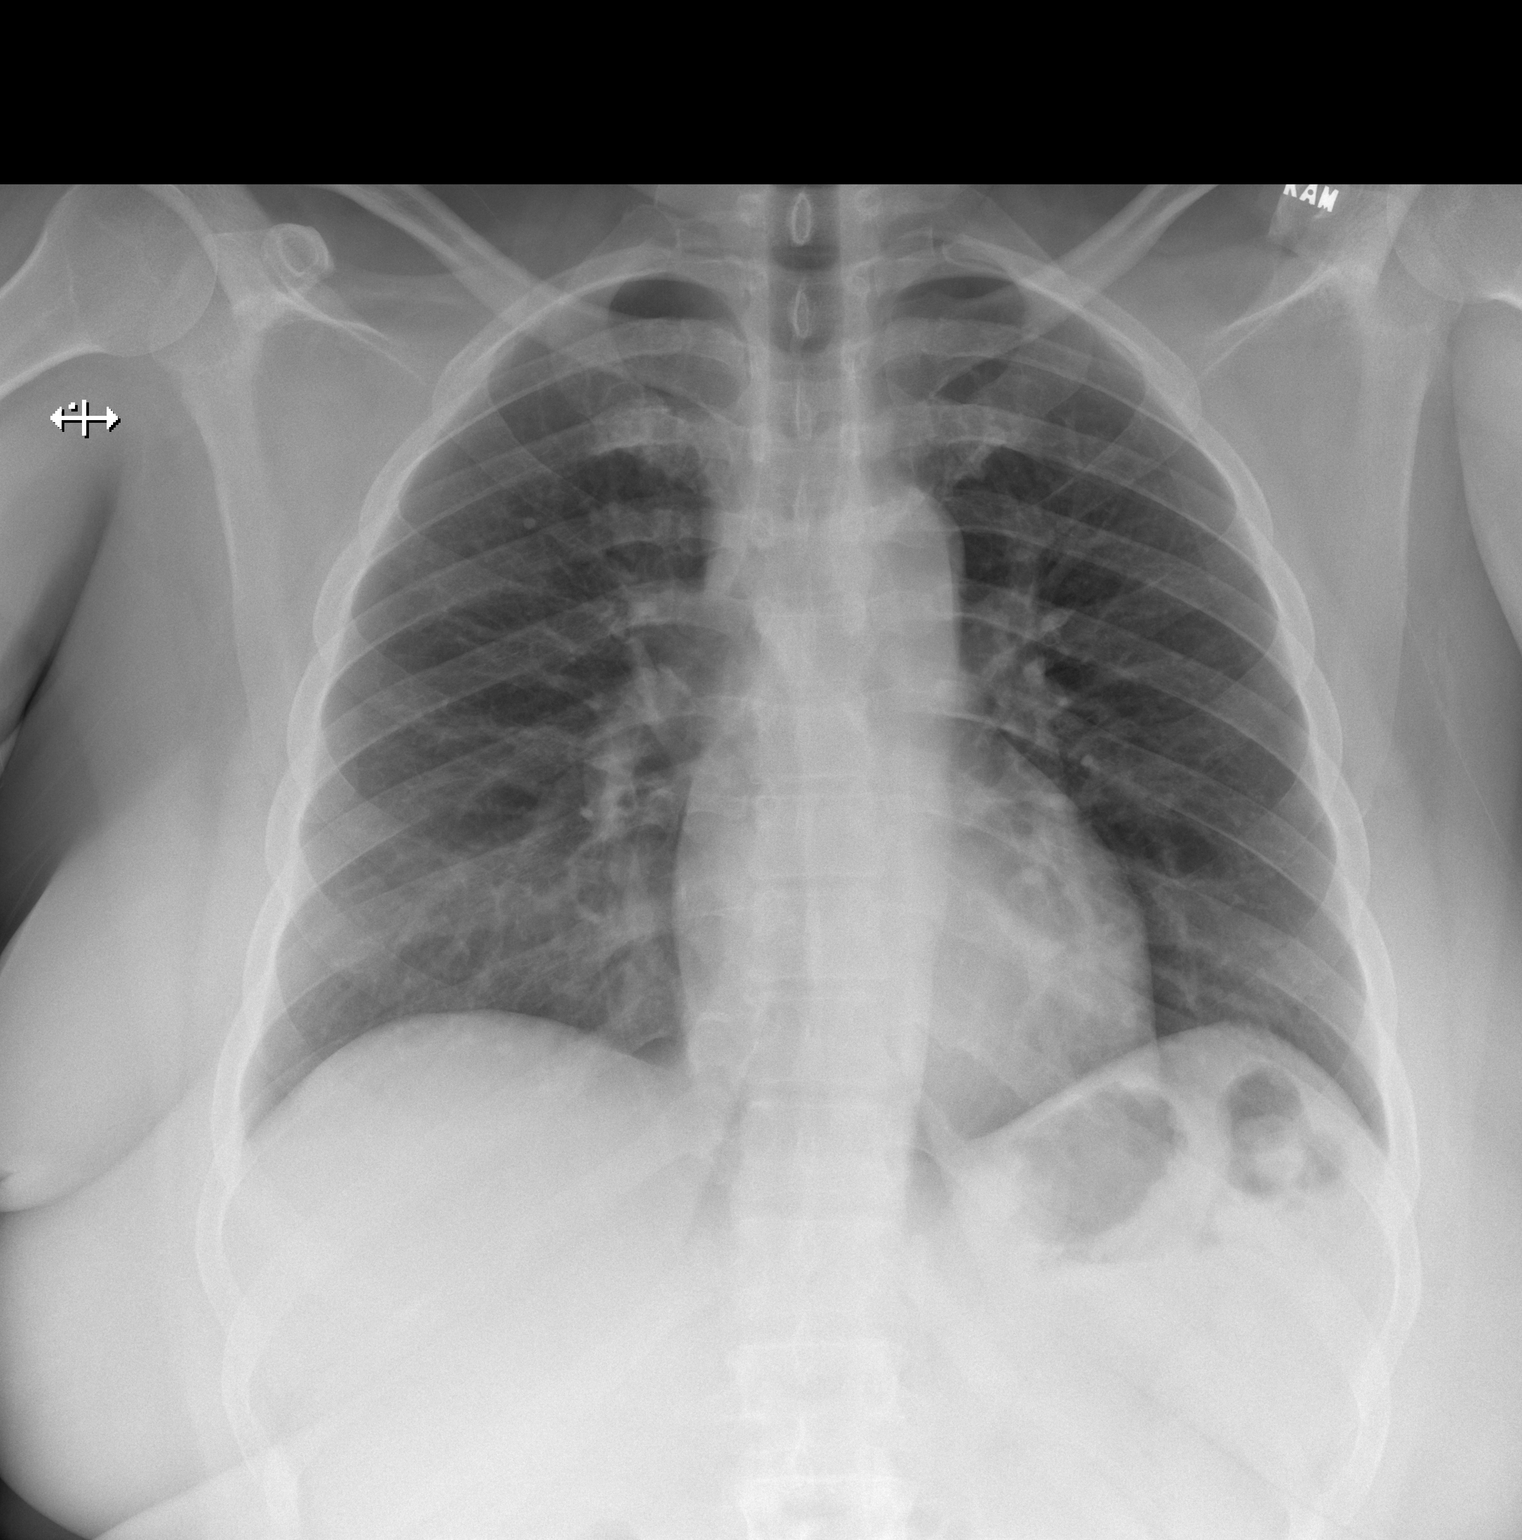

[w chest lat]
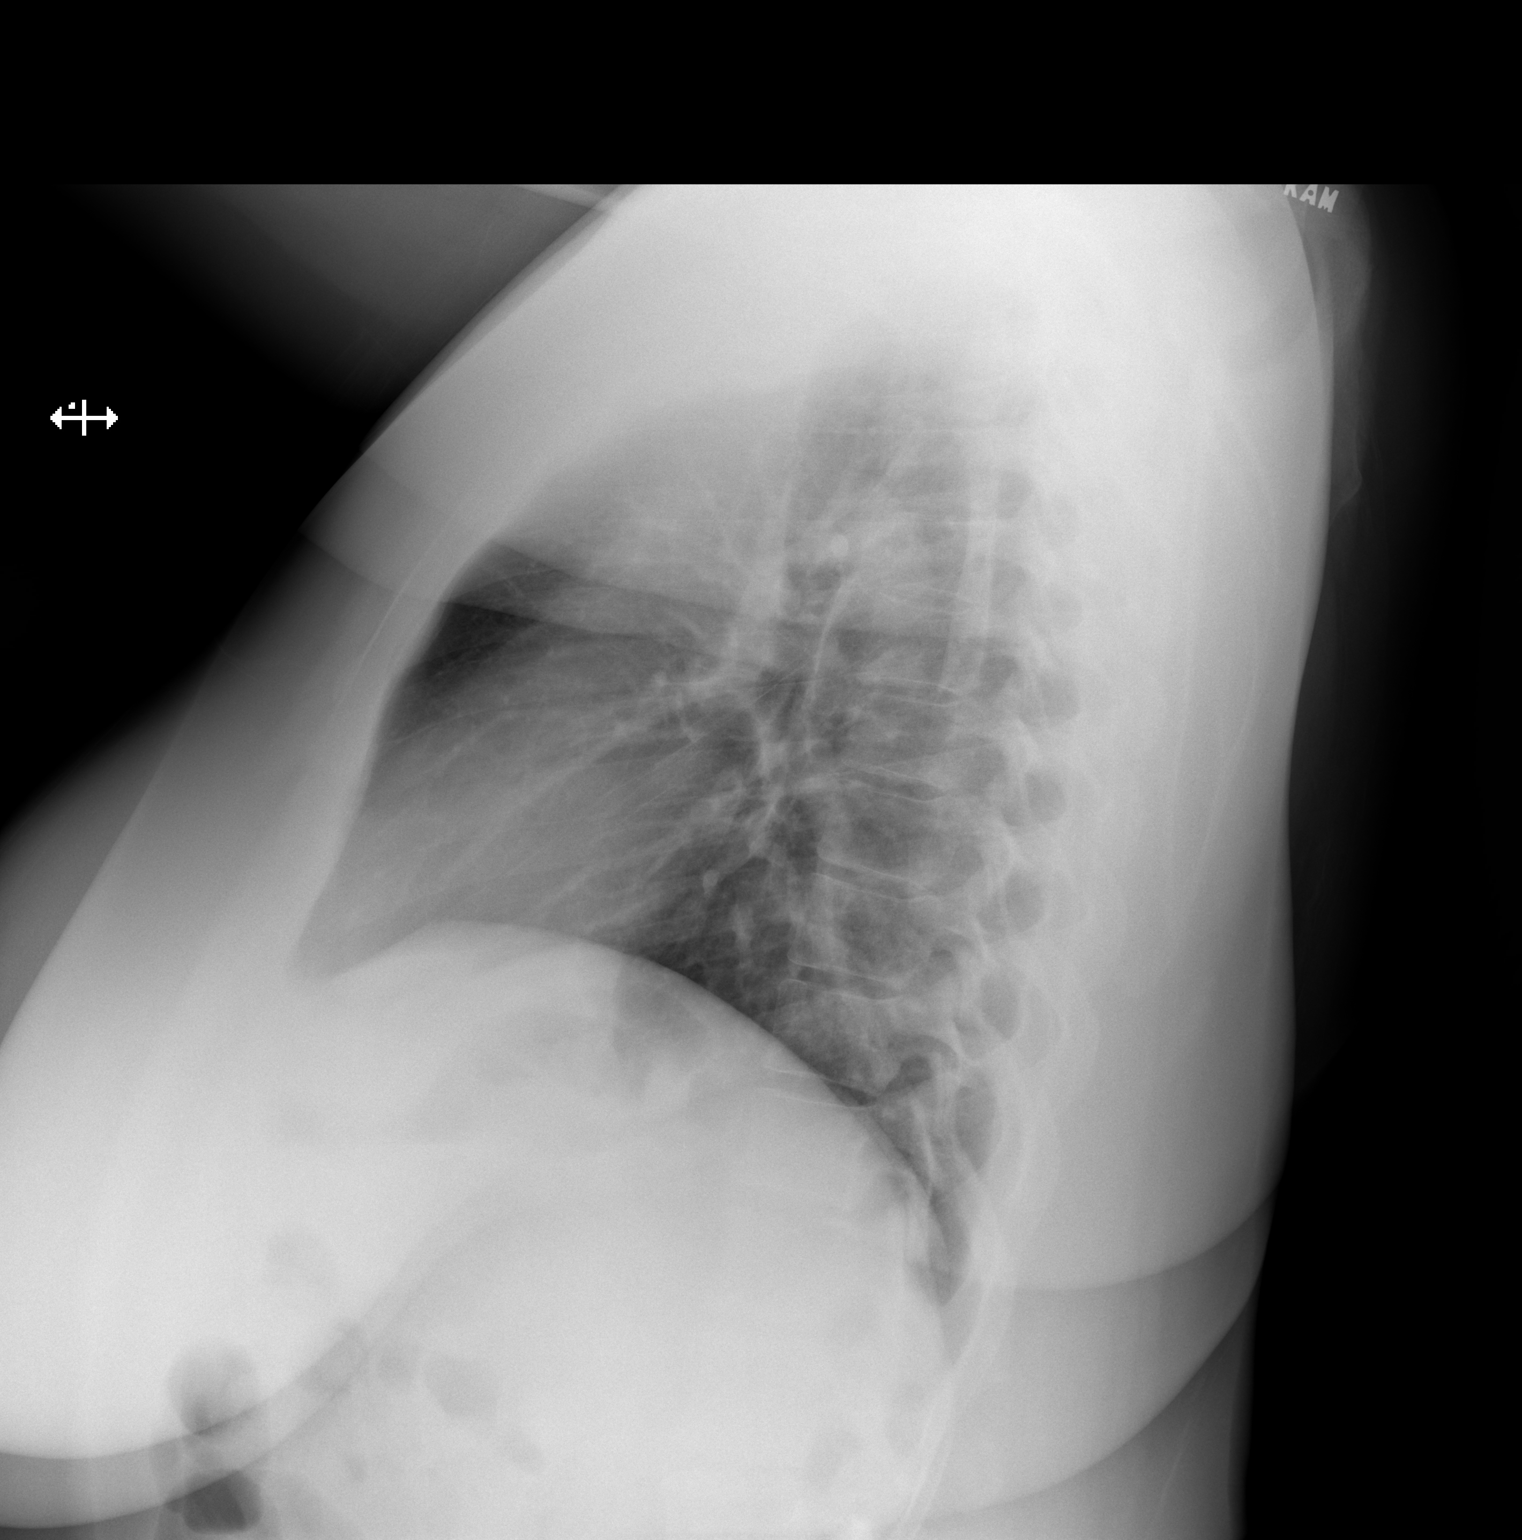

[2 of 2 positions shown; findings below may reference images not displayed]

FINDINGS: Significant interval resolution of the previously seen consolidative
opacity in both lungs. Some residual reticular opacities present in
the medial right lung base. No new consolidation. No convincing
features of edema. No pneumothorax or effusion. The
cardiomediastinal contours are unremarkable. No acute osseous or
soft tissue abnormality.
IMPRESSION: 1. Significant interval resolution of the previously seen
consolidative opacity in both lungs.
2. Residual reticular opacities in the medial right lung base may
reflect some post infectious or inflammatory scarring, residual
infection is less favored.
